# Patient Record
Sex: Female | Born: 1995 | Hispanic: Yes | Marital: Single | Smoking: Never smoker
Health system: Southern US, Community
[De-identification: ages and names within clinical notes are randomized; demographics above are authoritative.]

## PROBLEM LIST (undated history)

## (undated) DIAGNOSIS — Z789 Other specified health status: Secondary | ICD-10-CM

## (undated) HISTORY — DX: Other specified health status: Z78.9

## (undated) HISTORY — PX: NO PAST SURGERIES: SHX2092

---
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
## 2018-07-30 ENCOUNTER — Other Ambulatory Visit: Payer: Self-pay

## 2018-07-30 ENCOUNTER — Emergency Department (HOSPITAL_COMMUNITY)
Admission: EM | Admit: 2018-07-30 | Discharge: 2018-07-30 | Disposition: A | Discharge status: Home / Self Care | Payer: Self-pay | Attending: Emergency Medicine | Admitting: Emergency Medicine

## 2018-07-30 ENCOUNTER — Encounter (HOSPITAL_COMMUNITY): Payer: Self-pay | Admitting: Student

## 2018-07-30 ENCOUNTER — Emergency Department (HOSPITAL_COMMUNITY): Payer: Worker's Compensation

## 2018-07-30 DIAGNOSIS — Y9289 Other specified places as the place of occurrence of the external cause: Secondary | ICD-10-CM | POA: Insufficient documentation

## 2018-07-30 DIAGNOSIS — S60222A Contusion of left hand, initial encounter: Secondary | ICD-10-CM | POA: Insufficient documentation

## 2018-07-30 DIAGNOSIS — Z79899 Other long term (current) drug therapy: Secondary | ICD-10-CM | POA: Insufficient documentation

## 2018-07-30 DIAGNOSIS — W230XXA Caught, crushed, jammed, or pinched between moving objects, initial encounter: Secondary | ICD-10-CM | POA: Insufficient documentation

## 2018-07-30 DIAGNOSIS — S6992XA Unspecified injury of left wrist, hand and finger(s), initial encounter: Secondary | ICD-10-CM

## 2018-07-30 DIAGNOSIS — Y9389 Activity, other specified: Secondary | ICD-10-CM | POA: Insufficient documentation

## 2018-07-30 DIAGNOSIS — Y990 Civilian activity done for income or pay: Secondary | ICD-10-CM | POA: Insufficient documentation

## 2018-07-30 MED ORDER — NAPROXEN 500 MG PO TABS: 500.0000 mg | ORAL_TABLET | Freq: Two times a day (BID) | ORAL | 0 refills | Status: DC

## 2018-07-30 MED ORDER — OXYCODONE-ACETAMINOPHEN 5-325 MG PO TABS: 1.0000 | ORAL_TABLET | Freq: Once | ORAL | Status: DC

## 2018-07-30 MED ORDER — ACETAMINOPHEN 325 MG PO TABS
650.0000 mg | ORAL_TABLET | Freq: Once | ORAL | Status: AC
  Administered 2018-07-30: 650 mg via ORAL
  Filled 2018-07-30: qty 2

## 2018-07-30 NOTE — ED Provider Notes (Signed)
[facility]   CSN: [id number] Arrival date & time: 07/30/18  1058    History   Chief Complaint Chief Complaint  Patient presents with  . Hand Injury    HPI Sierra Shannon is a 23 y.o. female without significant past medical hx who presents to the ED with complaints of L hand pain s/p injury which occurred just PTA. Patient states she was at work when a heavy steel door closed on her L hand/wrist. Onset of pain immediately. Currently 6/10 in severity, worse with movement, no alleviating factors, no intervention PTA. Associated bruising & swelling. R hand dominant. Denies numbness, tingling, or weakness. No other areas of injury. LMP 1 month prior, denies chance of pregnancy.      HPI  No past medical history on file.  Patient Active Problem List   Diagnosis Date Noted  . Foster care (status) 09/29/2015    No past surgical history on file.   OB History   No obstetric history on file.      Home Medications    Prior to Admission medications   Medication Sig Start Date End Date Taking? Authorizing Provider  benzonatate (TESSALON) 100 MG capsule Take 2 capsules (200 mg total) by mouth 2 (two) times daily as needed for cough. 01/31/16   Barrett HenleNadeau, Nicole Elizabeth, PA-C  cephALEXin (KEFLEX) 500 MG capsule Take 1 capsule (500 mg total) by mouth 4 (four) times daily. 03/07/16   Elpidio AnisUpstill, Shari, PA-C  oxymetazoline (AFRIN NASAL SPRAY) 0.05 % nasal spray Place 1 spray into both nostrils 2 (two) times daily. Do not use spray for more than 3 days to prevent rebound rhinorrhea 01/31/16   Barrett HenleNadeau, Nicole Elizabeth, PA-C  phenazopyridine (PYRIDIUM) 200 MG tablet Take 1 tablet (200 mg total) by mouth 3 (three) times daily. 03/07/16   Elpidio AnisUpstill, Shari, PA-C    Family History No family history on file.  Social History Social History   Tobacco Use  . Smoking status: Never Smoker  . Smokeless tobacco: Never Used  Substance Use  Topics  . Alcohol use: No  . Drug use: No     Allergies   Patient has no known allergies.   Review of Systems Review of Systems  Constitutional: Negative for chills and fever.  Musculoskeletal: Positive for arthralgias and joint swelling.  Skin: Positive for color change (bruising).  Neurological: Negative for weakness and numbness.     Physical Exam Updated Vital Signs BP 127/68 (BP Location: Right Arm)   Pulse 76   Temp 99 F (37.2 C) (Oral)   Resp 15   SpO2 100%   Physical Exam Vitals signs and nursing note reviewed.  Constitutional:      General: She is not in acute distress.    Appearance: Normal appearance. She is not ill-appearing or toxic-appearing.  HENT:     Head: Normocephalic and atraumatic.  Neck:     Musculoskeletal: Normal range of motion and neck supple.     Comments: No midline tenderness.  Cardiovascular:     Rate and Rhythm: Normal rate.     Pulses:          Radial pulses are 2+ on the right side and 2+ on the left side.  Pulmonary:     Effort: No respiratory distress.     Breath sounds: Normal breath sounds.  Musculoskeletal:     Comments: Upper extremities: Base of the L 1st digit w/ swelling & ecchymosis w/ mild erythema. Otherwise no appreciable  swelling, edema, erythema, ecchymosis, warmth, or open wounds. Patient has intact AROM throughout the UEs including MCP/IP joints with the exception of mild decrease in flexion/extension of the L 1st IP/MCP joint- able to move through majority of each of these motions.  Tender to palpation the the L 1st phalange, IP joint, & MCP as well as the anatomical snuffbox, and the distal radius. Otherwise nontender.   Skin:    General: Skin is warm and dry.     Capillary Refill: Capillary refill takes less than 2 seconds.  Neurological:     Mental Status: She is alert.     Comments: Alert. Clear speech. Sensation grossly intact to bilateral upper extremities. 5/5 symmetric grip strength. Able to perform OK  sign w/ mild limitation in thumb extension to LUE, OK sign, & cross 2nd/3rd digits.  Ambulatory.   Psychiatric:        Mood and Affect: Mood normal.        Behavior: Behavior normal.    ED Treatments / Results  Labs (all labs ordered are listed, but only abnormal results are displayed) Labs Reviewed - No data to display  EKG None  Radiology Dg Wrist Complete Left  Result Date: 07/30/2018 CLINICAL DATA:  Door closed on left hand and wrist at work. EXAM: LEFT WRIST - COMPLETE 3+ VIEW; LEFT HAND - COMPLETE 3+ VIEW COMPARISON:  None. FINDINGS: There is no evidence of fracture or dislocation. There is no evidence of arthropathy or other focal bone abnormality. Soft tissues are unremarkable. IMPRESSION: Negative. Electronically Signed   By: Obie Dredge M.D.   On: 07/30/2018 12:05   Dg Hand Complete Left  Result Date: 07/30/2018 CLINICAL DATA:  Door closed on left hand and wrist at work. EXAM: LEFT WRIST - COMPLETE 3+ VIEW; LEFT HAND - COMPLETE 3+ VIEW COMPARISON:  None. FINDINGS: There is no evidence of fracture or dislocation. There is no evidence of arthropathy or other focal bone abnormality. Soft tissues are unremarkable. IMPRESSION: Negative. Electronically Signed   By: Obie Dredge M.D.   On: 07/30/2018 12:05    Procedures Procedures (including critical care time)  SPLINT APPLICATION Date/Time: 12:46 PM Authorized by: Harvie Heck Consent: Verbal consent obtained. Risks and benefits: risks, benefits and alternatives were discussed Consent given by: patient Splint applied by: RN Location details: LUE Splint type: thumb spica brace Supplies used: thumb spica brace Post-procedure: The splinted body part was neurovascularly unchanged following the procedure. Patient tolerance: Patient tolerated the procedure well with no immediate complications.   Medications Ordered in ED Medications  acetaminophen (TYLENOL) tablet 650 mg (650 mg Oral Given 07/30/18 1121)      Initial Impression / Assessment and Plan / ED Course  I have reviewed the triage vital signs and the nursing notes.  Pertinent labs & imaging results that were available during my care of the patient were reviewed by me and considered in my medical decision making (see chart for details).    Patient presents to the ED with complaints of pain to the  L hand/wrist pain s/p injury just PTA. Exam without some ecchymosis & swelling. ROM fairly intact w/ very mild limitation of 1st IP/MCP. Tender to palpation to 1st phalanges, IP, MCP, snuffbox area and stial L radius. NVI distally. Xray negative for fracture/dislocation. Given she is tender over the anatomical snuffbox will place in her thumb spica brace to be worn at all times w/ hand surgery follow up. PRICE recommended. Prescription for Naproxen. I discussed results, treatment plan, need  for follow-up, and return precautions with the patient. Provided opportunity for questions, patient confirmed understanding and are in agreement with plan.   Final Clinical Impressions(s) / ED Diagnoses   Final diagnoses:  Injury of left hand, initial encounter    ED Discharge Orders         Ordered    naproxen (NAPROSYN) 500 MG tablet  2 times daily     07/30/18 [address], [place], PA-C 07/30/18 1246    Benjiman Core, MD 07/30/18 1506

## 2018-07-30 NOTE — ED Triage Notes (Signed)
Pt at work was holding a door and let it go abruptly and it slammed into her L hand.  No gross deformity noted. Pain and redness extends from L thumb to wrist.  No abrasions, CMS wnl

## 2018-07-30 NOTE — Discharge Instructions (Addendum)
Please read and follow all provided instructions.  You have been seen today for an injury to your left hand, the x-rays were negative, but we have placed you in a brace as there is a bone in the hand that sometimes does not show fractures on the initial imaging- for this reason we would like you to remain in the brace until you have followed up with orthopedics or primary care.   Tests performed today include: An x-ray of the affected area - does NOT show any broken bones or dislocations.  Vital signs. See below for your results today.   Home care instructions: -- *PRICE in the first 24-48 hours after injury: Protect (with brace, splint, sling), if given by your provider Rest Ice- Do not apply ice pack directly to your skin, place towel or similar between your skin and ice/ice pack. Apply ice for 20 min, then remove for 40 min while awake Compression- Wear brace, elastic bandage, splint as directed by your provider Elevate affected extremity above the level of your heart when not walking around for the first 24-48 hours   Medications:   - Naproxen is a nonsteroidal anti-inflammatory medication that will help with pain and swelling. Be sure to take this medication as prescribed with food, 1 pill every 12 hours,  It should be taken with food, as it can cause stomach upset, and more seriously, stomach bleeding. Do not take other nonsteroidal anti-inflammatory medications with this such as Advil, Motrin, Aleve, Mobic, Goodie Powder, or Motrin.    You make take Tylenol per over the counter dosing with these medications.   We have prescribed you new medication(s) today. Discuss the medications prescribed today with your pharmacist as they can have adverse effects and interactions with your other medicines including over the counter and prescribed medications. Seek medical evaluation if you start to experience new or abnormal symptoms after taking one of these medicines, seek care immediately if you  start to experience difficulty breathing, feeling of your throat closing, facial swelling, or rash as these could be indications of a more serious allergic reaction   Follow-up instructions: Please follow-up with your primary care provider or the provided orthopedic physician (bone specialist) if you continue to have significant pain in 1 week. In this case you may have a more severe injury that requires further care.   Return instructions:  Please return if your digits or extremity are numb or tingling, appear gray or blue, or you have severe pain (also elevate the extremity and loosen splint or wrap if you were given one) Please return if you have redness or fevers.  Please return to the Emergency Department if you experience worsening symptoms.  Please return if you have any other emergent concerns. Additional Information:  Your vital signs today were: BP 127/68 (BP Location: Right Arm)    Pulse 76    Temp 99 F (37.2 C) (Oral)    Resp 15    Ht 5\' 2"  (1.575 m)    Wt 56.7 kg    SpO2 100%    BMI 22.86 kg/m  If your blood pressure (BP) was elevated above 135/85 this visit, please have this repeated by your doctor within one month. ---------------

## 2018-09-30 ENCOUNTER — Inpatient Hospital Stay (HOSPITAL_COMMUNITY)
Admission: AD | Admit: 2018-09-30 | Discharge: 2018-10-01 | Disposition: A | Discharge status: Home / Self Care | Payer: Self-pay | Attending: Obstetrics & Gynecology | Admitting: Obstetrics & Gynecology

## 2018-09-30 ENCOUNTER — Inpatient Hospital Stay (HOSPITAL_COMMUNITY): Payer: Self-pay

## 2018-09-30 ENCOUNTER — Other Ambulatory Visit: Payer: Self-pay

## 2018-09-30 ENCOUNTER — Encounter (HOSPITAL_COMMUNITY): Payer: Self-pay | Admitting: Student

## 2018-09-30 DIAGNOSIS — Z3A01 Less than 8 weeks gestation of pregnancy: Secondary | ICD-10-CM | POA: Insufficient documentation

## 2018-09-30 DIAGNOSIS — O209 Hemorrhage in early pregnancy, unspecified: Secondary | ICD-10-CM | POA: Insufficient documentation

## 2018-09-30 DIAGNOSIS — O3680X0 Pregnancy with inconclusive fetal viability, not applicable or unspecified: Secondary | ICD-10-CM

## 2018-09-30 DIAGNOSIS — O26891 Other specified pregnancy related conditions, first trimester: Secondary | ICD-10-CM

## 2018-09-30 DIAGNOSIS — O26899 Other specified pregnancy related conditions, unspecified trimester: Secondary | ICD-10-CM

## 2018-09-30 DIAGNOSIS — Z6791 Unspecified blood type, Rh negative: Secondary | ICD-10-CM

## 2018-09-30 LAB — URINALYSIS, ROUTINE W REFLEX MICROSCOPIC
Bilirubin Urine: NEGATIVE
Glucose, UA: NEGATIVE mg/dL
Ketones, ur: 20 mg/dL — AB
Leukocytes,Ua: NEGATIVE
Nitrite: NEGATIVE
Protein, ur: NEGATIVE mg/dL
Specific Gravity, Urine: 1.026 (ref 1.005–1.030)
pH: 5 (ref 5.0–8.0)

## 2018-09-30 LAB — WET PREP, GENITAL
Clue Cells Wet Prep HPF POC: NONE SEEN
Sperm: NONE SEEN
Trich, Wet Prep: NONE SEEN
Yeast Wet Prep HPF POC: NONE SEEN

## 2018-09-30 LAB — POCT PREGNANCY, URINE: Preg Test, Ur: NEGATIVE

## 2018-09-30 LAB — HCG, QUANTITATIVE, PREGNANCY: hCG, Beta Chain, Quant, S: 26 m[IU]/mL — ABNORMAL HIGH (ref <5)

## 2018-09-30 LAB — ABO/RH: ABO/RH(D): O NEG

## 2018-09-30 MED ORDER — RHO D IMMUNE GLOBULIN 1500 UNIT/2ML IJ SOSY
300.0000 ug | PREFILLED_SYRINGE | Freq: Once | INTRAMUSCULAR | Status: AC
  Administered 2018-09-30: 300 ug via INTRAMUSCULAR
  Filled 2018-09-30: qty 2

## 2018-09-30 NOTE — MAU Note (Signed)
Pt reports to MAU stating last month she missed her period completely and then took two test they were both negative. Pt states that about 1 week ago she started bleeding and it is consistent with her period but she took another test yesterday and it came back faint positive after 3 min. Pt reports a hx of miscarriage. Pt is unsure if she passed a clot or the pregnancy earlier today. Pt used 2 pads today.

## 2018-09-30 NOTE — MAU Provider Note (Addendum)
Chief Complaint: Abdominal Pain and Vaginal Bleeding   First Provider Initiated Contact with Patient 09/30/18 2012     SUBJECTIVE HPI: Sierra Shannon is a 23 y.o. G2P2210 at 1852w6d by LMP who presents [facility] reporting abdominal pain & vaginal bleeding. Symptoms started 1 week ago. Reports suprapubic cramping & light vaginal bleeding. Is not saturating pads or passing clots. States the bleeding is like a "really light period".   Location: abdomen Quality: cramping Severity: currently denies/10 on pain scale Duration: 1 week Timing: intermittent Modifying factors: none Associated signs and symptoms: vaginal bleeding  No past medical history on file. OB History  No obstetric history on file.   No past surgical history on file. Social History   Socioeconomic History  . Marital status: Single    Spouse name: Not on file  . Number of children: Not on file  . Years of education: Not on file  . Highest education level: Not on file  Occupational History  . Not on file  Social Needs  . Financial resource strain: Not on file  . Food insecurity    Worry: Not on file    Inability: Not on file  . Transportation needs    Medical: Not on file    Non-medical: Not on file  Tobacco Use  . Smoking status: Never Smoker  . Smokeless tobacco: Never Used  Substance and Sexual Activity  . Alcohol use: No  . Drug use: No  . Sexual activity: Not on file  Lifestyle  . Physical activity    Days per week: Not on file    Minutes per session: Not on file  . Stress: Not on file  Relationships  . Social Musicianconnections    Talks on phone: Not on file    Gets together: Not on file    Attends religious service: Not on file    Active member of club or organization: Not on file    Attends meetings of clubs or organizations: Not on file    Relationship status: Not on file  . Intimate partner violence    Fear of current or ex partner: Not on file    Emotionally abused: Not on file     Physically abused: Not on file    Forced sexual activity: Not on file  Other Topics Concern  . Not on file  Social History Narrative  . Not on file   No family history on file. No current facility-administered medications on file prior to encounter.    Current Outpatient Medications on File Prior to Encounter  Medication Sig Dispense Refill  . benzonatate (TESSALON) 100 MG capsule Take 2 capsules (200 mg total) by mouth 2 (two) times daily as needed for cough. 20 capsule 0  . cephALEXin (KEFLEX) 500 MG capsule Take 1 capsule (500 mg total) by mouth 4 (four) times daily. 20 capsule 0  . naproxen (NAPROSYN) 500 MG tablet Take 1 tablet (500 mg total) by mouth 2 (two) times daily. 10 tablet 0  . oxymetazoline (AFRIN NASAL SPRAY) 0.05 % nasal spray Place 1 spray into both nostrils 2 (two) times daily. Do not use spray for more than 3 days to prevent rebound rhinorrhea 30 mL 0  . phenazopyridine (PYRIDIUM) 200 MG tablet Take 1 tablet (200 mg total) by mouth 3 (three) times daily. 6 tablet 0   No Known Allergies  I have reviewed patient's Past Medical Hx, Surgical Hx, Family Hx, Social Hx, medications and allergies.   Review of Systems  Constitutional: Negative.   Gastrointestinal: Positive for abdominal pain (none currently).  Genitourinary: Positive for vaginal bleeding.    OBJECTIVE Patient Vitals for the past 24 hrs:  BP Temp Temp src Pulse Resp SpO2  09/30/18 1938 134/64 98.1 F (36.7 C) Oral 60 17 -  09/30/18 1700 111/79 98.5 F (36.9 C) Oral 72 14 100 %   Constitutional: Well-developed, well-nourished female in no acute distress.  Respiratory: normal rate and effort.  MS: Extremities nontender, no edema, normal ROM Neurologic: Alert and oriented x 4.    LAB RESULTS Results for orders placed or performed during the hospital encounter of 09/30/18 (from the past 24 hour(s))  Pregnancy, urine POC     Status: None   Collection Time: 09/30/18  7:49 PM  Result Value Ref  Range   Preg Test, Ur NEGATIVE NEGATIVE  Urinalysis, Routine w reflex microscopic     Status: Abnormal   Collection Time: 09/30/18  8:00 PM  Result Value Ref Range   Color, Urine YELLOW YELLOW   APPearance CLEAR CLEAR   Specific Gravity, Urine 1.026 1.005 - 1.030   pH 5.0 5.0 - 8.0   Glucose, UA NEGATIVE NEGATIVE mg/dL   Hgb urine dipstick LARGE (A) NEGATIVE   Bilirubin Urine NEGATIVE NEGATIVE   Ketones, ur 20 (A) NEGATIVE mg/dL   Protein, ur NEGATIVE NEGATIVE mg/dL   Nitrite NEGATIVE NEGATIVE   Leukocytes,Ua NEGATIVE NEGATIVE   RBC / HPF 11-20 0 - 5 RBC/hpf   WBC, UA 0-5 0 - 5 WBC/hpf   Bacteria, UA RARE (A) NONE SEEN   Squamous Epithelial / LPF 0-5 0 - 5   Mucus PRESENT   ABO/Rh     Status: None   Collection Time: 09/30/18  8:30 PM  Result Value Ref Range   ABO/RH(D)      O NEG Performed at [facility], [address] [place], [place] [address]   hCG, quantitative, pregnancy     Status: Abnormal   Collection Time: 09/30/18  8:30 PM  Result Value Ref Range   hCG, Beta Chain, Quant, S 26 (H) <5 mIU/mL  Rh IG workup (includes ABO/Rh)     Status: None (Preliminary result)   Collection Time: 09/30/18  8:30 PM  Result Value Ref Range   Gestational Age(Wks) 4    ABO/RH(D) O NEG    Antibody Screen NEG    Unit Number [id number]    Blood Component Type RHIG    Unit division 00    Status of Unit ALLOCATED    Transfusion Status OK TO TRANSFUSE     IMAGING No results found.  MAU COURSE Orders Placed This Encounter  Procedures  . Wet prep, genital  . US OB LESS THAN 14 WEEKS WITH OB TRANSVAGINAL  . Urinalysis, Routine w reflex microscopic  . hCG, quantitative, pregnancy  . Pregnancy, urine POC  . ABO/Rh  . Rh IG workup (includes ABO/Rh)   Meds ordered this encounter  Medications  . rho (d) immune globulin (RHIG/RHOPHYLAC) injection 300 mcg    MDM UPT negative. Patient reports positive at home yesterday so will proceed with HCG HCG 26 O  negative  Rhogam ordered. Patient will be brought to room for further evaluation since her hcg is positive.   Care turned over to Wynelle BourgeoisMarie Williams CNM Judeth HornLawrence, Erin, NP 09/30/2018  10:03 PM  [place] Ob Less Than 14 Weeks With Ob Transvaginal  Result Date: 09/30/2018 CLINICAL DATA:  23 year old female with vaginal bleeding in the 1st trimester  of pregnancy. Quantitative beta hCG 26. Estimated gestational age by LMP 7 weeks 6 days. EXAM: OBSTETRIC <14 WK [place] AND TRANSVAGINAL OB US TECHNIQUE: Both transabdominal and transvaginal ultrasound examinations were performed for complete evaluation of the gestation as well as the maternal uterus, adnexal regions, and pelvic cul-de-sac. Transvaginal technique was performed to assess early pregnancy. COMPARISON:  None. FINDINGS: Intrauterine gestational sac: None Maternal uterus/adnexae: Bland appearing endometrium, only 5-6 millimeters in thickness. Small volume of simple appearing pelvic free fluid. The left ovary appears normal measuring 2.6 x 1.5 x 1.8 centimeters. The right ovary also appears normal measuring 3.0 x 1.8 x 2.0 centimeters. IMPRESSION: 1. No IUP identified. Both ovaries appear within normal limits. Small volume of simple appearing pelvic free fluid. 2. Differential considerations include early IUP, failed IUP, and occult ectopic pregnancy. 3. Recommend serial quantitative beta HCG and repeat ultrasound as necessary. Electronically Signed   By: Genevie Ann M.D.   On: 09/30/2018 22:48   Discussed findings with patient We still cannot rule out ectopic pregnancy just yet Will need to repeat HCG in 48 hours Ectopic precautions  Encouraged to return here or to other Urgent Care/ED if she develops worsening of symptoms, increase in pain, fever, or other concerning symptoms.    Seabron Spates, CNM

## 2018-09-30 NOTE — MAU Note (Signed)
Pt arrived from [facility] with no notes. Appeared on MAU board at 1 hour 59 min.

## 2018-10-01 DIAGNOSIS — Z3A01 Less than 8 weeks gestation of pregnancy: Secondary | ICD-10-CM

## 2018-10-01 DIAGNOSIS — O360910 Maternal care for other rhesus isoimmunization, first trimester, not applicable or unspecified: Secondary | ICD-10-CM

## 2018-10-01 DIAGNOSIS — R109 Unspecified abdominal pain: Secondary | ICD-10-CM

## 2018-10-01 DIAGNOSIS — O3680X0 Pregnancy with inconclusive fetal viability, not applicable or unspecified: Secondary | ICD-10-CM

## 2018-10-01 DIAGNOSIS — O26891 Other specified pregnancy related conditions, first trimester: Secondary | ICD-10-CM

## 2018-10-01 LAB — RH IG WORKUP (INCLUDES ABO/RH)
ABO/RH(D): O NEG
Antibody Screen: NEGATIVE
Gestational Age(Wks): 4
Unit division: 0

## 2018-10-01 LAB — GC/CHLAMYDIA PROBE AMP (~~LOC~~) NOT AT ARMC
Chlamydia: NEGATIVE
Neisseria Gonorrhea: NEGATIVE

## 2018-10-01 NOTE — Discharge Instructions (Signed)
Vaginal Bleeding During Pregnancy, First Trimester ° °A small amount of bleeding (spotting) from the vagina is common during early pregnancy. Sometimes the bleeding is normal and does not cause problems. At other times, though, bleeding may be a sign of something serious. Tell your doctor about any bleeding from your vagina right away. °Follow these instructions at home: °Activity °· Follow your doctor's instructions about how active you can be. °· If needed, make plans for someone to help with your normal activities. °· Do not have sex or orgasms until your doctor says that this is safe. °General instructions °· Take over-the-counter and prescription medicines only as told by your doctor. °· Watch your condition for any changes. °· Write down: °? The number of pads you use each day. °? How often you change pads. °? How soaked (saturated) your pads are. °· Do not use tampons. °· Do not douche. °· If you pass any tissue from your vagina, save it to show to your doctor. °· Keep all follow-up visits as told by your doctor. This is important. °Contact a doctor if: °· You have vaginal bleeding at any time while you are pregnant. °· You have cramps. °· You have a fever. °Get help right away if: °· You have very bad cramps in your back or belly (abdomen). °· You pass large clots or a lot of tissue from your vagina. °· Your bleeding gets worse. °· You feel light-headed. °· You feel weak. °· You pass out (faint). °· You have chills. °· You are leaking fluid from your vagina. °· You have a gush of fluid from your vagina. °Summary °· Sometimes vaginal bleeding during pregnancy is normal and does not cause problems. At other times, bleeding may be a sign of something serious. °· Tell your doctor about any bleeding from your vagina right away. °· Follow your doctor's instructions about how active you can be. You may need someone to help you with your normal activities. °This information is not intended to replace advice given to  you by your health care provider. Make sure you discuss any questions you have with your health care provider. °Document Released: 07/20/2013 Document Revised: 06/24/2018 Document Reviewed: 06/06/2016 °Elsevier Patient Education © 2020 Elsevier Inc. ° °

## 2018-10-03 ENCOUNTER — Inpatient Hospital Stay (HOSPITAL_COMMUNITY)
Admission: AD | Admit: 2018-10-03 | Discharge: 2018-10-03 | Disposition: A | Discharge status: Home / Self Care | Payer: Self-pay | Attending: Obstetrics and Gynecology | Admitting: Obstetrics and Gynecology

## 2018-10-03 ENCOUNTER — Other Ambulatory Visit: Payer: Self-pay

## 2018-10-03 DIAGNOSIS — O039 Complete or unspecified spontaneous abortion without complication: Secondary | ICD-10-CM | POA: Insufficient documentation

## 2018-10-03 LAB — HCG, QUANTITATIVE, PREGNANCY: hCG, Beta Chain, Quant, S: 8 m[IU]/mL — ABNORMAL HIGH (ref <5)

## 2018-10-03 NOTE — MAU Note (Signed)
.   Sierra Shannon is a 23 y.o. at [redacted]w[redacted]d here in MAU for follow up quant. Pt denies any vaginal bleeding or pain LMP: 08/06/18 Pain score: 0 Vitals:   10/03/18 1820  BP: (!) 114/49  Pulse: 69  Resp: 16  Temp: 98.9 F (37.2 C)  SpO2: 100%     Lab orders placed from triage: QUANT

## 2018-10-03 NOTE — Discharge Instructions (Signed)
Miscarriage °A miscarriage is the loss of an unborn baby (fetus) before the 20th week of pregnancy. Most miscarriages happen during the first 3 months of pregnancy. Sometimes, a miscarriage can happen before a woman knows that she is pregnant. °Having a miscarriage can be an emotional experience. If you have had a miscarriage, talk with your health care provider about any questions you may have about miscarrying, the grieving process, and your plans for future pregnancy. °What are the causes? °A miscarriage may be caused by: °· Problems with the genes or chromosomes of the fetus. These problems make it impossible for the baby to develop normally. They are often the result of random errors that occur early in the development of the baby, and are not passed from parent to child (not inherited). °· Infection of the cervix or uterus. °· Conditions that affect hormone balance in the body. °· Problems with the cervix, such as the cervix opening and thinning before pregnancy is at term (cervical insufficiency). °· Problems with the uterus. These may include: °? A uterus with an abnormal shape. °? Fibroids in the uterus. °? Congenital abnormalities. These are problems that were present at birth. °· Certain medical conditions. °· Smoking, drinking alcohol, or using drugs. °· Injury (trauma). °In many cases, the cause of a miscarriage is not known. °What are the signs or symptoms? °Symptoms of this condition include: °· Vaginal bleeding or spotting, with or without cramps or pain. °· Pain or cramping in the abdomen or lower back. °· Passing fluid, tissue, or blood clots from the vagina. °How is this diagnosed? °This condition may be diagnosed based on: °· A physical exam. °· Ultrasound. °· Blood tests. °· Urine tests. °How is this treated? °Treatment for a miscarriage is sometimes not necessary if you naturally pass all the tissue that was in your uterus. If necessary, this condition may be treated with: °· Dilation and  curettage (D&C). This is a procedure in which the cervix is stretched open and the lining of the uterus (endometrium) is scraped. This is done only if tissue from the fetus or placenta remains in the body (incomplete miscarriage). °· Medicines, such as: °? Antibiotic medicine, to treat infection. °? Medicine to help the body pass any remaining tissue. °? Medicine to reduce (contract) the size of the uterus. These medicines may be given if you have a lot of bleeding. °If you have Rh negative blood and your baby was Rh positive, you will need a shot of a medicine called Rh immunoglobulinto protect your future babies from Rh blood problems. "Rh-negative" and "Rh-positive" refer to whether or not the blood has a specific protein found on the surface of red blood cells (Rh factor). °Follow these instructions at home: °Medicines ° °· Take over-the-counter and prescription medicines only as told by your health care provider. °· If you were prescribed antibiotic medicine, take it as told by your health care provider. Do not stop taking the antibiotic even if you start to feel better. °· Do not take NSAIDs, such as aspirin and ibuprofen, unless they are approved by your health care provider. These medicines can cause bleeding. °Activity °· Rest as directed. Ask your health care provider what activities are safe for you. °· Have someone help with home and family responsibilities during this time. °General instructions °· Keep track of the number of sanitary pads you use each day and how soaked (saturated) they are. Write down this information. °· Monitor the amount of tissue or blood clots that   you pass from your vagina. Save any large amounts of tissue for your health care provider to examine. °· Do not use tampons, douche, or have sex until your health care provider approves. °· To help you and your partner with the process of grieving, talk with your health care provider or seek counseling. °· When you are ready, meet with  your health care provider to discuss any important steps you should take for your health. Also, discuss steps you should take to have a healthy pregnancy in the future. °· Keep all follow-up visits as told by your health care provider. This is important. °Where to find more information °· The American Congress of Obstetricians and Gynecologists: www.acog.org °· U.S. Department of Health and Human Services Office of Women’s Health: www.womenshealth.gov °Contact a health care provider if: °· You have a fever or chills. °· You have a foul smelling vaginal discharge. °· You have more bleeding instead of less. °Get help right away if: °· You have severe cramps or pain in your back or abdomen. °· You pass blood clots or tissue from your vagina that is walnut-sized or larger. °· You soak more than 1 regular sanitary pad in an hour. °· You become light-headed or weak. °· You pass out. °· You have feelings of sadness that take over your thoughts, or you have thoughts of hurting yourself. °Summary °· Most miscarriages happen in the first 3 months of pregnancy. Sometimes miscarriage happens before a woman even knows that she is pregnant. °· Follow your health care provider's instruction for home care. Keep all follow-up appointments. °· To help you and your partner with the process of grieving, talk with your health care provider or seek counseling. °This information is not intended to replace advice given to you by your health care provider. Make sure you discuss any questions you have with your health care provider. °Document Released: 08/29/2000 Document Revised: 06/27/2018 Document Reviewed: 04/10/2016 °Elsevier Patient Education © 2020 Elsevier Inc. ° °

## 2018-10-03 NOTE — MAU Provider Note (Signed)
Ms. Sierra Shannon  is a 23 y.o. G1P0  at [redacted]w[redacted]d who presents to MAU today for follow-up quant hCG. The patient denies abdominal pain, vaginal bleeding, N/V or fever.   BP (!) 114/49   Pulse 69   Temp 98.9 F (37.2 C)   Resp 16   LMP 08/06/2018 (Approximate)   SpO2 100%   GENERAL: Well-developed, well-nourished female in no acute distress.  HEENT: Normocephalic, atraumatic.   LUNGS: Effort normal HEART: Regular rate  SKIN: Warm, dry and without erythema PSYCH: Normal mood and affect   A: 1. Miscarriage   -rh negative. Given rhogam 7/14 Component     Latest Ref Rng & Units 09/30/2018 10/03/2018  HCG, Beta Chain, Quant, S     <5 mIU/mL 26 (H) 8 (H)     P: Discharge home Msg to [facility] for [facility] f/u appt Discussed reasons to return to [place], [place], NP  10/03/2018 7:20 PM

## 2018-10-27 ENCOUNTER — Telehealth: Payer: Self-pay | Admitting: Advanced Practice Midwife

## 2018-10-27 NOTE — Telephone Encounter (Signed)
Called the patient to inform of the upcoming visit. The line picked up, however there was no answer. After saying hello a few times the line dropped. Mailing the patient an appointment reminder letter.

## 2018-11-04 ENCOUNTER — Encounter: Payer: Self-pay | Admitting: Family Medicine

## 2018-11-04 ENCOUNTER — Telehealth: Payer: Self-pay

## 2018-11-04 ENCOUNTER — Other Ambulatory Visit: Payer: Self-pay

## 2018-11-04 DIAGNOSIS — Z91199 Patient's noncompliance with other medical treatment and regimen due to unspecified reason: Secondary | ICD-10-CM

## 2018-11-04 DIAGNOSIS — Z5329 Procedure and treatment not carried out because of patient's decision for other reasons: Secondary | ICD-10-CM

## 2018-11-04 NOTE — Progress Notes (Signed)
@  1017am message stating that the number you've dialed is not in service to check the number and dial again. Called the work number and the patient is not at work today.

## 2018-11-04 NOTE — Progress Notes (Signed)
Unable to reach patient for virtual appointment  Wende Mott, CNM 11/04/18 10:57 AM

## 2019-03-20 NOTE — L&D Delivery Note (Signed)
Sierra Shannon is a 24 y.o. G3P0020 s/p SVD at [redacted]w[redacted]d. She was admitted for IOL for GHTN.   ROM: 28h 53m with light mec fluid GBS Status: positive Maximum Maternal Temperature: 99.9  Labor Progress and Delivery: . She progressed with augmentation to complete and pushed 1hr 30 minutes to deliver. Called to room and patient was complete and pushing. Head delivered LOA. No nuchal cord present. Shoulder and body delivered in usual fashion. Infant without spontaneous cry and floppy upon delivery, placed on mother's abdomen, dried and stimulated without response. Cord clamped by CNM and cut by FOB at 30 seconds. Code APGAR called. Cord blood drawn. Cord gas drawn. Placenta delivered spontaneously with gentle cord traction. Fundus firm with massage and Pitocin. Labia, perineum, vagina, and cervix inspected inspected with no lacerations.  Mom and baby stable prior to transfer to postpartum. She plans on breastfeeding. She is unsure of method for birth control. Please see NICU note for additional documentation.   Delivery Note At 10:01 PM a viable and healthy female was delivered via Vaginal, Spontaneous (Presentation:   Occiput Anterior).  APGAR: 4, ; weight pending .   Placenta status: Spontaneous, Intact.  Cord: 3 vessels with the following complications: None.  Cord pH: 7.19  Anesthesia: Epidural Episiotomy: None Lacerations: None Suture Repair: none Est. Blood Loss (mL): 100  Mom to postpartum.  Baby to Couplet care / Skin to Skin.  Sharyon Cable CNM 01/24/2020, 10:27 PM

## 2019-05-19 ENCOUNTER — Other Ambulatory Visit: Payer: Self-pay

## 2019-05-19 ENCOUNTER — Encounter (HOSPITAL_COMMUNITY): Payer: Self-pay | Admitting: *Deleted

## 2019-05-19 ENCOUNTER — Inpatient Hospital Stay (HOSPITAL_COMMUNITY)
Admission: AD | Admit: 2019-05-19 | Discharge: 2019-05-19 | Disposition: A | Discharge status: Home / Self Care | Payer: Self-pay | Attending: Obstetrics & Gynecology | Admitting: Obstetrics & Gynecology

## 2019-05-19 ENCOUNTER — Inpatient Hospital Stay (HOSPITAL_COMMUNITY): Payer: Self-pay

## 2019-05-19 DIAGNOSIS — O26891 Other specified pregnancy related conditions, first trimester: Secondary | ICD-10-CM | POA: Insufficient documentation

## 2019-05-19 DIAGNOSIS — R109 Unspecified abdominal pain: Secondary | ICD-10-CM | POA: Insufficient documentation

## 2019-05-19 DIAGNOSIS — Z3A00 Weeks of gestation of pregnancy not specified: Secondary | ICD-10-CM | POA: Insufficient documentation

## 2019-05-19 DIAGNOSIS — O3680X0 Pregnancy with inconclusive fetal viability, not applicable or unspecified: Secondary | ICD-10-CM

## 2019-05-19 LAB — WET PREP, GENITAL
Clue Cells Wet Prep HPF POC: NONE SEEN
Sperm: NONE SEEN
Trich, Wet Prep: NONE SEEN
Yeast Wet Prep HPF POC: NONE SEEN

## 2019-05-19 LAB — CBC
HCT: 43.1 % (ref 36.0–46.0)
Hemoglobin: 13.9 g/dL (ref 12.0–15.0)
MCH: 27.8 pg (ref 26.0–34.0)
MCHC: 32.3 g/dL (ref 30.0–36.0)
MCV: 86.2 fL (ref 80.0–100.0)
Platelets: 460 10*3/uL — ABNORMAL HIGH (ref 150–400)
RBC: 5 MIL/uL (ref 3.87–5.11)
RDW: 14.8 % (ref 11.5–15.5)
WBC: 12.1 10*3/uL — ABNORMAL HIGH (ref 4.0–10.5)
nRBC: 0 % (ref 0.0–0.2)

## 2019-05-19 LAB — URINALYSIS, ROUTINE W REFLEX MICROSCOPIC
Bilirubin Urine: NEGATIVE
Glucose, UA: NEGATIVE mg/dL
Hgb urine dipstick: NEGATIVE
Ketones, ur: NEGATIVE mg/dL
Leukocytes,Ua: NEGATIVE
Nitrite: NEGATIVE
Protein, ur: NEGATIVE mg/dL
Specific Gravity, Urine: 1.016 (ref 1.005–1.030)
pH: 6 (ref 5.0–8.0)

## 2019-05-19 LAB — HIV ANTIBODY (ROUTINE TESTING W REFLEX): HIV Screen 4th Generation wRfx: NONREACTIVE

## 2019-05-19 LAB — POCT PREGNANCY, URINE: Preg Test, Ur: POSITIVE — AB

## 2019-05-19 LAB — HCG, QUANTITATIVE, PREGNANCY: hCG, Beta Chain, Quant, S: 554 m[IU]/mL — ABNORMAL HIGH (ref <5)

## 2019-05-19 NOTE — Discharge Instructions (Signed)
Return to MAU:  If you have heavier bleeding that soaks through more that 2 pads per hour for an hour or more  If you bleed so much that you feel like you might pass out or you do pass out  If you have significant abdominal pain that is not improved with Tylenol 1000 mg  If you develop a fever > 100.5   

## 2019-05-19 NOTE — MAU Note (Signed)
+  HPT x3, first was 2 days ago.  Is feeling like a little cramping, not much, comes and goes.

## 2019-05-19 NOTE — MAU Provider Note (Signed)
History     CSN: [id number]  Arrival date and time: 05/19/19 1524   None     Chief Complaint  Patient presents with  . Abdominal Pain  . Possible Pregnancy   HPI  Ms.  Sierra Shannon is a 24 y.o. year old [id number] female at [redacted]w[redacted]d weeks gestation who presents to MAU reporting (+) HPT x 3 (first one was taken 3 days ago) and some intermittent cramping "not too much." She denies any VB. She has a h/o SAB x 2 and is concerned this pregnancy will miscarry, too.   History reviewed. No pertinent past medical history.  History reviewed. No pertinent surgical history.  History reviewed. No pertinent family history.  Social History   Tobacco Use  . Smoking status: Never Smoker  . Smokeless tobacco: Never Used  Substance Use Topics  . Alcohol use: No  . Drug use: No    Allergies: No Known Allergies  No medications prior to admission.    Review of Systems  Constitutional: Negative.   Eyes: Negative.   Respiratory: Negative.   Cardiovascular: Negative.   Gastrointestinal: Negative.   Endocrine: Negative.   Genitourinary: Positive for pelvic pain ("cramping off and on; not too much").  Musculoskeletal: Negative.   Skin: Negative.   Allergic/Immunologic: Negative.   Neurological: Negative.   Hematological: Negative.   Psychiatric/Behavioral: Negative.    Physical Exam   Blood pressure 121/79, pulse 81, temperature 98.9 F (37.2 C), temperature source Oral, resp. rate 16, height 5\' 2"  (1.575 m), weight 51.7 kg, last menstrual period 04/02/2019, SpO2 100 %.  Physical Exam  Nursing note and vitals reviewed. Constitutional: She is oriented to person, place, and time. She appears well-developed and well-nourished.  HENT:  Head: Normocephalic and atraumatic.  Eyes: Pupils are equal, round, and reactive to light.  Cardiovascular: Normal rate.  Respiratory: Effort normal.  GI: Soft.  Genitourinary:    Genitourinary Comments: Vaginal swabs collected by self-swab. Pelvic  exam offered - pt declined   Musculoskeletal:        General: Normal range of motion.     Cervical back: Normal range of motion.  Neurological: She is alert and oriented to person, place, and time.  Skin: Skin is warm and dry.  Psychiatric: She has a normal mood and affect. Her behavior is normal. Judgment and thought content normal.  Appears very upset    MAU Course  Procedures  MDM CCUA UPT CBC ABO/Rh HCG Wet Prep GC/CT HIV  OB < 14 wks 04/04/2019 with TV  Results for orders placed or performed during the hospital encounter of 05/19/19 (from the past 72 hour(s))  Pregnancy, urine POC     Status: Abnormal   Collection Time: 05/19/19  3:56 PM  Result Value Ref Range   Preg Test, Ur POSITIVE (A) NEGATIVE    Comment:        THE SENSITIVITY OF THIS METHODOLOGY IS >24 mIU/mL   Urinalysis, Routine w reflex microscopic     Status: Abnormal   Collection Time: 05/19/19  3:58 PM  Result Value Ref Range   Color, Urine YELLOW YELLOW   APPearance HAZY (A) CLEAR   Specific Gravity, Urine 1.016 1.005 - 1.030   pH 6.0 5.0 - 8.0   Glucose, UA NEGATIVE NEGATIVE mg/dL   Hgb urine dipstick NEGATIVE NEGATIVE   Bilirubin Urine NEGATIVE NEGATIVE   Ketones, ur NEGATIVE NEGATIVE mg/dL   Protein, ur NEGATIVE NEGATIVE mg/dL   Nitrite NEGATIVE NEGATIVE   Leukocytes,Ua NEGATIVE NEGATIVE  Comment: Performed at [facility], [address], [place], [place] [address]  GC/Chlamydia probe amp ([place])not at [facility]     Status: None   Collection Time: 05/19/19  4:51 PM  Result Value Ref Range   Neisseria Gonorrhea Negative    Chlamydia Negative    Comment Normal Reference [place] Chlamydia - Negative    Comment      Normal Reference Range Neisseria Gonorrhea - Negative  CBC     Status: Abnormal   Collection Time: 05/19/19  5:00 PM  Result Value Ref Range   WBC 12.1 (H) 4.0 - 10.5 K/uL   RBC 5.00 3.87 - 5.11 MIL/uL   Hemoglobin 13.9 12.0 - 15.0 g/dL   HCT 28.0 03.4 - 91.7 %   MCV 86.2  80.0 - 100.0 fL   MCH 27.8 26.0 - 34.0 pg   MCHC 32.3 30.0 - 36.0 g/dL   RDW 91.5 05.6 - 97.9 %   Platelets 460 (H) 150 - 400 K/uL   nRBC 0.0 0.0 - 0.2 %    Comment: Performed at [facility], [address], [place], [place] [address]  hCG, quantitative, pregnancy     Status: Abnormal   Collection Time: 05/19/19  5:00 PM  Result Value Ref Range   hCG, Beta Chain, Quant, S 554 (H) <5 mIU/mL    Comment:          GEST. AGE      CONC.  (mIU/mL)   <=1 WEEK        5 - 50     2 WEEKS       50 - 500     3 WEEKS       100 - 10,000     4 WEEKS     1,000 - 30,000     5 WEEKS     3,500 - 115,000   6-8 WEEKS     12,000 - 270,000    12 WEEKS     15,000 - 220,000        FEMALE AND NON-PREGNANT FEMALE:     LESS THAN 5 mIU/mL Performed at [facility], [address], [place], [place] [address]   HIV Antibody (routine testing w rflx)     Status: None   Collection Time: 05/19/19  5:00 PM  Result Value Ref Range   HIV Screen 4th Generation wRfx NON REACTIVE NON REACTIVE    Comment: Performed at [facility], [address], [place], [place] [address]  Wet prep, genital     Status: Abnormal   Collection Time: 05/19/19  7:14 PM  Result Value Ref Range   Yeast Wet Prep HPF POC NONE SEEN NONE SEEN   Trich, Wet Prep NONE SEEN NONE SEEN   Clue Cells Wet Prep HPF POC NONE SEEN NONE SEEN   WBC, Wet Prep HPF POC MODERATE (A) NONE SEEN   Sperm NONE SEEN     Comment: Performed at [facility], [address] [place], [place] [address]   US OB LESS THAN 14 WEEKS WITH [place] TRANSVAGINAL  Result Date: 05/19/2019 CLINICAL DATA:  A 24 year old female with last menstrual period April 02, 2019, abdominal pain, and positive urine pregnancy test. EXAM: OBSTETRIC <14 WK [place] AND TRANSVAGINAL OB US TECHNIQUE: Both transabdominal and transvaginal ultrasound examinations were performed for complete evaluation of the gestation as well as the maternal uterus, adnexal regions, and pelvic cul-de-sac.  Transvaginal technique was performed to  assess early pregnancy. COMPARISON:  September 30, 2018. FINDINGS: Intrauterine gestational sac: None. Ectopic gestational sac: None imaged. Maternal uterus/adnexae: A 13 mm endometrial thickness. No free fluid in the pelvis. A 2.7 x 1.6 x 2.0 cm right ovary with normal follicular pattern and blood flow on color Doppler imaging. A 4.0 x 3.0 x 4.0 cm left ovary with normal blood flow on color Doppler imaging and a central 3.2 x 3.1 x 2.5 cm complex cyst without peripheral hypervascularity on Doppler imaging. IMPRESSION: No intrauterine gestational sac or apparent ectopic gestational sac. Differential considerations include an early intrauterine pregnancy or pregnancy of unknown location. Recommend serial quantitative beta HCG levels and repeat ultrasonography as necessary. A 3.2 cm left ovarian complex cyst, possibly an evolving hemorrhagic corpus luteum cyst. Normal right ovary.  No right or left ovarian torsion. Electronically Signed   By: Revonda Humphrey   On: 05/19/2019 19:03     Assessment and Plan  Pregnancy of unknown anatomic location - Plan: Discharge patient - Follow-up HCG level on Thursday 05/21/2019 at [place] provided on ectopic pregnancy & threatened miscarriage   Abdominal pain during pregnancy in first trimester  - Return to MAU:  If you have heavier bleeding that soaks through more that 2 pads per hour for an hour or more  If you bleed so much that you feel like you might pass out or you do pass out  If you have significant abdominal pain that is not improved with Tylenol 1000 mg  If you develop a fever > 100.5   - Discharge patient - Keep scheduled appt at [facility] on Thursday 05/21/2019 @ 0900 - Patient verbalized an understanding of the plan of care and agrees.     Laury Deep, MSN, CNM 05/19/2019, 9:18 PM

## 2019-05-20 LAB — GC/CHLAMYDIA PROBE AMP (~~LOC~~) NOT AT ARMC
Chlamydia: NEGATIVE
Comment: NEGATIVE
Comment: NORMAL
Neisseria Gonorrhea: NEGATIVE

## 2019-05-22 ENCOUNTER — Other Ambulatory Visit: Payer: Self-pay

## 2019-05-22 ENCOUNTER — Ambulatory Visit (INDEPENDENT_AMBULATORY_CARE_PROVIDER_SITE_OTHER): Payer: Self-pay | Admitting: *Deleted

## 2019-05-22 DIAGNOSIS — O3680X0 Pregnancy with inconclusive fetal viability, not applicable or unspecified: Secondary | ICD-10-CM

## 2019-05-22 LAB — BETA HCG QUANT (REF LAB): hCG Quant: 1281 m[IU]/mL

## 2019-05-22 NOTE — Progress Notes (Signed)
Here for stat bhcg. Co once in a while mild cramping less than when in MAU. Denies any bleeding. Explained we will draw stat bhcg and have her leave.then when results come back in about 2 hours,I will review with provider and call her with results. She voices understanding.  Sahory Nordling,RN

## 2019-05-22 NOTE — Progress Notes (Signed)
11:45 received results stat bhcg 1281.Reveiwed results and history with Dr. Alysia Penna. Ordered to notify patient bhcg appropriate rise but we need to do another [place] in about a week to confirm viable pregnancy.  I scheduled [place] for 05/29/19 at 0800.  I called Blasa and notified her of results and plan of care. She voices so much happiness with results. I explained she will have [place] in MFM then come to [place] for results. She voices understanding of plan of care. Clarice Zulauf,RN

## 2019-05-25 NOTE — Progress Notes (Signed)
Agree with A & P. 

## 2019-05-29 ENCOUNTER — Ambulatory Visit (INDEPENDENT_AMBULATORY_CARE_PROVIDER_SITE_OTHER): Payer: Self-pay | Admitting: General Practice

## 2019-05-29 ENCOUNTER — Ambulatory Visit (HOSPITAL_COMMUNITY)
Admission: RE | Admit: 2019-05-29 | Discharge: 2019-05-29 | Disposition: A | Discharge status: Home / Self Care | Payer: Self-pay | Source: Ambulatory Visit | Attending: Obstetrics and Gynecology | Admitting: Obstetrics and Gynecology

## 2019-05-29 ENCOUNTER — Other Ambulatory Visit: Payer: Self-pay

## 2019-05-29 DIAGNOSIS — Z712 Person consulting for explanation of examination or test findings: Secondary | ICD-10-CM

## 2019-05-29 DIAGNOSIS — O3680X0 Pregnancy with inconclusive fetal viability, not applicable or unspecified: Secondary | ICD-10-CM | POA: Insufficient documentation

## 2019-05-29 NOTE — Progress Notes (Signed)
Patient presents to office today for viability ultrasound results. Reviewed results with Dr Vergie Living who finds early gestational sac with yolk sac but no fetal pole visualized yet. Patient should have follow up ultrasound in 11-14 days.   Reviewed results with patient and discussed follow up ultrasound in 2 weeks. Ultrasound will be scheduled upon check out. Patient verbalized understanding.  Chase Caller RN BSN 05/29/19

## 2019-05-31 NOTE — Progress Notes (Signed)
Patient seen and assessed by nursing staff during this encounter. I have reviewed the chart and agree with the documentation and plan.  Matt Delpizzo, MD 05/31/2019 7:29 PM   

## 2019-06-12 ENCOUNTER — Ambulatory Visit (HOSPITAL_COMMUNITY): Admission: RE | Admit: 2019-06-12 | Payer: Self-pay | Source: Ambulatory Visit

## 2019-06-22 ENCOUNTER — Other Ambulatory Visit: Payer: Self-pay

## 2019-06-22 ENCOUNTER — Ambulatory Visit (HOSPITAL_COMMUNITY)
Admission: RE | Admit: 2019-06-22 | Discharge: 2019-06-22 | Disposition: A | Discharge status: Home / Self Care | Payer: Self-pay | Source: Ambulatory Visit | Attending: Obstetrics and Gynecology | Admitting: Obstetrics and Gynecology

## 2019-06-22 DIAGNOSIS — O3680X0 Pregnancy with inconclusive fetal viability, not applicable or unspecified: Secondary | ICD-10-CM | POA: Insufficient documentation

## 2019-06-30 ENCOUNTER — Telehealth: Payer: Self-pay | Admitting: General Practice

## 2019-06-30 ENCOUNTER — Ambulatory Visit (INDEPENDENT_AMBULATORY_CARE_PROVIDER_SITE_OTHER): Payer: Self-pay | Admitting: *Deleted

## 2019-06-30 DIAGNOSIS — Z3480 Encounter for supervision of other normal pregnancy, unspecified trimester: Secondary | ICD-10-CM | POA: Insufficient documentation

## 2019-06-30 NOTE — Telephone Encounter (Signed)
Patient was given number to DSS to apply for Medicaid and number to [employer].

## 2019-06-30 NOTE — Progress Notes (Signed)
  Virtual Visit via Telephone Note  I connected with Sierra Shannon on 06/30/19 at  8:50 AM EDT by telephone and verified that I am speaking with the correct person using two identifiers.  Location: Patient: Sierra Shannon MRN: [id number] Provider: Clovis Pu, RN   I discussed the limitations, risks, security and privacy concerns of performing an evaluation and management service by telephone and the availability of in person appointments. I also discussed with the patient that there may be a patient responsible charge related to this service. The patient expressed understanding and agreed to proceed.   History of Present Illness: PRENATAL INTAKE SUMMARY  Sierra Shannon presents today New OB Nurse Interview.  OB History    Gravida  3   Para      Term      Preterm      AB  2   Living        SAB  2   TAB      Ectopic      Multiple      Live Births             I have reviewed the patient's medical, obstetrical, social, and family histories, medications, and available lab results.  SUBJECTIVE She has no unusual complaints   Observations/Objective: Initial nurse interview for history/labs (New OB)  EDD: 01/23/2020 by early ultrasound GA: [redacted]w[redacted]d [id number] FHT: non face to face interview  GENERAL APPEARANCE: non face to face interview  Assessment and Plan: Normal pregnancy Prenatal care Labs to be complete at next appointment with Donia Ast, NP 07/15/2019 Continue PNV Patient to apply for medicaid or [employer]  Follow Up Instructions:   I discussed the assessment and treatment plan with the patient. The patient was provided an opportunity to ask questions and all were answered. The patient agreed with the plan and demonstrated an understanding of the instructions.   The patient was advised to call back or seek an in-person evaluation if the symptoms worsen or if the condition fails to improve as anticipated.  I provided 15 minutes of  non-face-to-face time during this encounter.   Clovis Pu, RN

## 2019-07-15 ENCOUNTER — Encounter: Payer: Self-pay | Admitting: General Practice

## 2019-07-15 ENCOUNTER — Ambulatory Visit (INDEPENDENT_AMBULATORY_CARE_PROVIDER_SITE_OTHER): Payer: Self-pay | Admitting: Women's Health

## 2019-07-15 ENCOUNTER — Other Ambulatory Visit (HOSPITAL_COMMUNITY)
Admission: RE | Admit: 2019-07-15 | Discharge: 2019-07-15 | Disposition: A | Discharge status: Home / Self Care | Payer: Self-pay | Source: Ambulatory Visit | Attending: Women's Health | Admitting: Women's Health

## 2019-07-15 ENCOUNTER — Encounter: Payer: Self-pay | Admitting: Women's Health

## 2019-07-15 ENCOUNTER — Other Ambulatory Visit: Payer: Self-pay

## 2019-07-15 DIAGNOSIS — Z3480 Encounter for supervision of other normal pregnancy, unspecified trimester: Secondary | ICD-10-CM | POA: Insufficient documentation

## 2019-07-15 DIAGNOSIS — O3680X0 Pregnancy with inconclusive fetal viability, not applicable or unspecified: Secondary | ICD-10-CM

## 2019-07-15 NOTE — Patient Instructions (Addendum)
Maternity Assessment Unit (MAU)  The Maternity Assessment Unit (MAU) is located at the [facility]. The address is: [address], [address], [place], [place] [address]. Please see map below for additional directions.    The Maternity Assessment Unit is designed to help you during your pregnancy, and for up to 6 weeks after delivery, with any pregnancy- or postpartum-related emergencies, if you think you are in labor, or if your water has broken. For example, if you experience nausea and vomiting, vaginal bleeding, severe abdominal or pelvic pain, elevated blood pressure or other problems related to your pregnancy or postpartum time, please come to the Maternity Assessment Unit for assistance.                      Safe Medications in Pregnancy    Acne: Benzoyl Peroxide Salicylic Acid  Backache/Headache: Tylenol: 2 regular strength every 4 hours OR              2 Extra strength every 6 hours  Colds/Coughs/Allergies: Benadryl (alcohol free) 25 mg every 6 hours as needed Breath right strips Claritin Cepacol throat lozenges Chloraseptic throat spray Cold-Eeze- up to three times per day Cough drops, alcohol free Flonase (by prescription only) Guaifenesin Mucinex Robitussin DM (plain only, alcohol free) Saline nasal spray/drops Sudafed (pseudoephedrine) & Actifed ** use only after [redacted] weeks gestation and if you do not have high blood pressure Tylenol Vicks Vaporub Zinc lozenges Zyrtec   Constipation: Colace Ducolax suppositories Fleet enema Glycerin suppositories Metamucil Milk of magnesia Miralax Senokot Smooth move tea  Diarrhea: Kaopectate Imodium A-D  *NO pepto Bismol  Hemorrhoids: Anusol Anusol HC Preparation H Tucks  Indigestion: Tums Maalox Mylanta Zantac  Pepcid  Insomnia: Benadryl (alcohol free) 25mg  every 6 hours as needed Tylenol PM Unisom, no Gelcaps  Leg  Cramps: Tums MagGel  Nausea/Vomiting:  Bonine Dramamine Emetrol Ginger extract Sea bands Meclizine  Nausea medication to take during pregnancy:  Unisom (doxylamine succinate 25 mg tablets) Take one tablet daily at bedtime. If symptoms are not adequately controlled, the dose can be increased to a maximum recommended dose of two tablets daily (1/2 tablet in the morning, 1/2 tablet mid-afternoon and one at bedtime). Vitamin B6 100mg  tablets. Take one tablet twice a day (up to 200 mg per day).  Skin Rashes: Aveeno products Benadryl cream or 25mg  every 6 hours as needed Calamine Lotion 1% cortisone cream  Yeast infection: Gyne-lotrimin 7 Monistat 7   **If taking multiple medications, please check labels to avoid duplicating the same active ingredients **take medication as directed on the label ** Do not exceed 4000 mg of tylenol in 24 hours **Do not take medications that contain aspirin or ibuprofen      AREA PEDIATRIC/FAMILY PRACTICE PHYSICIANS  [facility] [address] [place], [place] [address] Phone - [phone]   Fax - [phone]  [facility]ACK AMOS [address] [place], [place]  [address] Phone - [phone]   Fax - [phone]  [facility] [address] [place], [place]  [address] Phone - [phone]   Fax - [phone]  [facility] [address] [place], [place]  [address] Phone - [phone]   Fax - [phone]  [facility] [address] [place], [place]  [address] Phone - [phone]   Fax - [phone]  [facility] [address], [address] [place], [place]  [address] Phone -  [phone]   Fax - [place] [address], [place], [place]  [address] Phone - [phone]   Fax - [phone]  [place] [address] [place], [place], [place]   [address] Phone - [phone]   Fax - [place] [address] [place], [place]  [address] Phone - [phone]   Fax - [phone] [facility] [place]. [address] [place], [place]  [address] Phone - [phone]   Fax - [phone]  [facility]LE [place] [address] [place], [place]  [address] Phone - [phone]   Fax - [phone]  [facility] [place], [place], [place]  [address] Phone - [phone]   Fax - [place] [address] [place], [place]  [address] Phone - [phone]   Fax - [phone]  [facility] [address] [place], [place]  [address] Phone - [place], [place]  [address] Phone - [phone]   Fax - [place] [address] [place], [place]  [address] Phone - [phone]   Fax - [phone]  [place] [address] [place], [place]  [address] Phone - [phone]   Fax - [place]. [place], [place]  [address] Phone - [phone]   Fax - [place], [place], [place]  [address] Phone - [phone]   Fax - [place] [address], [place], [place]  [address] Phone - [phone]   Fax - [phone]  DAVID RUBIN [address] [place], [place]  [address] Phone - [phone]   Fax - [place] [address] [place], [place]  [address] Phone - [phone]   Fax - [phone]  [place] [address] [place], [place]  [address] Phone - [phone]   Fax - [phone] Arnaldo Natal [address] [place], [place]   [address] Phone - [phone]   Fax - [phone]  [place] [address] [place], [place]  [address] Phone - [phone]   Fax - [place] [address], [place],   [address] Phone - [phone]   Fax - [phone]    Childbirth Education Options: [facility] Department Classes:  Childbirth education classes can help you get ready for a positive parenting experience. You can also meet other expectant parents and get free stuff for your baby. Each class runs for five weeks on the same night and costs $45 for the mother-to-be and her support person. Medicaid covers the cost if you are eligible. Call [phone] to register. [facility] Childbirth Education:  [phone] or [phone] or [email or web address]  Baby & Me Class: Discuss newborn & infant parenting and family adjustment issues with other new mothers in a relaxed environment. Each week brings a new speaker or baby-centered activity. We encourage new mothers to join [place] every Thursday at 11:00am. Babies birth until crawling. No registration or fee. Daddy [employer]: This course offers Dads-to-be the tools and knowledge needed to feel confident on their journey to becoming new fathers. Experienced dads, who have been trained as  coaches, teach dads-to-be how to hold, comfort, diaper, swaddle and play with their infant while being able to support the new mom as well. A class for men taught by men. $25/dad Big Brother/Big Sister: Let your children share in the joy of a new brother or sister in this special class designed just for them. Class includes discussion about how families care for babies: swaddling, holding, diapering, safety as well as how they can be helpful in their new role. This class is designed for children ages 2 to 34, but any age is welcome. Please register each child individually. $5/child  Mom Talk: This mom-led  group offers support and connection to mothers as they journey through the adjustments and struggles of that sometimes overwhelming first year after the birth of a child. Tuesdays at 10:00am and Thursdays at 6:00pm. Babies welcome. No registration or fee. Breastfeeding Support Group: This group is a mother-to-mother support circle where moms have the opportunity to share their breastfeeding experiences. A Lactation Consultant is present for questions and concerns. Meets each Tuesday at 11:00am. No fee or registration. Breastfeeding Your Baby: Learn what to expect in the first days of breastfeeding your newborn.  This class will help you feel more confident with the skills needed to begin your breastfeeding experience. Many new mothers are concerned about breastfeeding after leaving the hospital. This class will also address the most common fears and challenges about breastfeeding during the first few weeks, months and beyond. (call for fee) Comfort Techniques and Tour: This 2 hour interactive class will provide you the opportunity to learn & practice hands-on techniques that can help relieve some of the discomfort of labor and encourage your baby to rotate toward the best position for birth. You and your partner will be able to try a variety of labor positions with birth balls and rebozos as well as practice breathing, relaxation, and visualization techniques. A tour of the [facility] is included with this class. $20 per registrant and support person Childbirth Class- Weekend Option: This class is a Weekend version of our Birth & Baby series. It is designed for parents who have a difficult time fitting several weeks of classes into their schedule. It covers the care of your newborn and the basics of labor and childbirth. It also includes a Maternity Care Center Tour of [facility] and lunch. The class is held two consecutive days: beginning on Friday evening from 6:30 - 8:30 p.m.  and the next day, Saturday from 9 a.m. - 4 p.m. (call for fee) [place] Dolin Class: Interested in a waterbirth?  This informational class will help you discover whether waterbirth is the right fit for you. Education about waterbirth itself, supplies you would need and how to assemble your support team is what you can expect from this class. Some obstetrical practices require this class in order to pursue a waterbirth. (Not all obstetrical practices offer waterbirth-check with your healthcare provider.) Register only the expectant mom, but you are encouraged to bring your partner to class! Required if planning waterbirth, no fee. Infant/Child CPR: Parents, grandparents, babysitters, and friends learn Cardio-Pulmonary Resuscitation skills for infants and children. You will also learn how to treat both conscious and unconscious choking in infants and children. This Family & Friends program does not offer certification. Register each participant individually to ensure that enough mannequins are available. (Call for fee) Grandparent Love: Expecting a grandbaby? This class is for you! Learn about the latest infant care and safety recommendations and ways to  support your own child as he or she transitions into the parenting role. Taught by Registered Nurses who are childbirth instructors, but most importantly...they are grandmothers too! $10/person. Childbirth Class- Natural Childbirth: This series of 5 weekly classes is for expectant parents who want to learn and practice natural methods of coping with the process of labor and childbirth. Relaxation, breathing, massage, visualization, role of the partner, and helpful positioning are highlighted. Participants learn how to be confident in their body's ability to give birth. This class will empower and help parents make informed decisions about their own care. Includes discussion that will help new parents transition into the immediate postpartum period. Maternity Care  Center Tour of [facility] is included. We suggest taking this class between 25-32 weeks, but it's only a recommendation. $75 per registrant and one support person or $30 Medicaid. Childbirth Class- 3 week Series: This option of 3 weekly classes helps you and your labor partner prepare for childbirth. Newborn care, labor & birth, cesarean birth, pain management, and comfort techniques are discussed and a Maternity Care Center Tour of [facility] is included. The class meets at the same time, on the same day of the week for 3 consecutive weeks beginning with the starting date you choose. $60 for registrant and one support person.  Marvelous Multiples: Expecting twins, triplets, or more? This class covers the differences in labor, birth, parenting, and breastfeeding issues that face multiples' parents. NICU tour is included. Led by a Certified Childbirth Educator who is the mother of twins. No fee. Caring for Baby: This class is for expectant and adoptive parents who want to learn and practice the most up-to-date newborn care for their babies. Focus is on birth through the first six weeks of life. Topics include feeding, bathing, diapering, crying, umbilical cord care, circumcision care and safe sleep. Parents learn to recognize symptoms of illness and when to call the pediatrician. Register only the mom-to-be and your partner or support person can plan to come with you! $10 per registrant and support person Childbirth Class- online option: This online class offers you the freedom to complete a Birth and Baby series in the comfort of your own home. The flexibility of this option allows you to review sections at your own pace, at times convenient to you and your support people. It includes additional video information, animations, quizzes, and extended activities. Get organized with helpful eClass tools, checklists, and trackers. Once you register online for the class, you will receive an email within a  few days to accept the invitation and begin the class when the time is right for you. The content will be available to you for 60 days. $60 for 60 days of online access for you and your support people.            First Trimester of Pregnancy The first trimester of pregnancy is from week 1 until the end of week 13 (months 1 through 3). A week after a sperm fertilizes an egg, the egg will implant on the wall of the uterus. This embryo will begin to develop into a baby. Genes from you and your partner will form the baby. The female genes will determine whether the baby will be a boy or a girl. At 6-8 weeks, the eyes and face will be formed, and the heartbeat can be seen on ultrasound. At the end of 12 weeks, all the baby's organs will be formed. Now that you are pregnant, you will want to do everything you  can to have a healthy baby. Two of the most important things are to get good prenatal care and to follow your health care provider's instructions. Prenatal care is all the medical care you receive before the baby's birth. This care will help prevent, find, and treat any problems during the pregnancy and childbirth. Body changes during your first trimester Your body goes through many changes during pregnancy. The changes vary from woman to woman.  You may gain or lose a couple of pounds at first.  You may feel sick to your stomach (nauseous) and you may throw up (vomit). If the vomiting is uncontrollable, call your health care provider.  You may tire easily.  You may develop headaches that can be relieved by medicines. All medicines should be approved by your health care provider.  You may urinate more often. Painful urination may mean you have a bladder infection.  You may develop heartburn as a result of your pregnancy.  You may develop constipation because certain hormones are causing the muscles that push stool through your intestines to slow down.  You may develop hemorrhoids or  swollen veins (varicose veins).  Your breasts may begin to grow larger and become tender. Your nipples may stick out more, and the tissue that surrounds them (areola) may become darker.  Your gums may bleed and may be sensitive to brushing and flossing.  Dark spots or blotches (chloasma, mask of pregnancy) may develop on your face. This will likely fade after the baby is born.  Your menstrual periods will stop.  You may have a loss of appetite.  You may develop cravings for certain kinds of food.  You may have changes in your emotions from day to day, such as being excited to be pregnant or being concerned that something may go wrong with the pregnancy and baby.  You may have more vivid and strange dreams.  You may have changes in your hair. These can include thickening of your hair, rapid growth, and changes in texture. Some women also have hair loss during or after pregnancy, or hair that feels dry or thin. Your hair will most likely return to normal after your baby is born. What to expect at prenatal visits During a routine prenatal visit:  You will be weighed to make sure you and the baby are growing normally.  Your blood pressure will be taken.  Your abdomen will be measured to track your baby's growth.  The fetal heartbeat will be listened to between weeks 10 and 14 of your pregnancy.  Test results from any previous visits will be discussed. Your health care provider may ask you:  How you are feeling.  If you are feeling the baby move.  If you have had any abnormal symptoms, such as leaking fluid, bleeding, severe headaches, or abdominal cramping.  If you are using any tobacco products, including cigarettes, chewing tobacco, and electronic cigarettes.  If you have any questions. Other tests that may be performed during your first trimester include:  Blood tests to find your blood type and to check for the presence of any previous infections. The tests will also be  used to check for low iron levels (anemia) and protein on red blood cells (Rh antibodies). Depending on your risk factors, or if you previously had diabetes during pregnancy, you may have tests to check for high blood sugar that affects pregnant women (gestational diabetes).  Urine tests to check for infections, diabetes, or protein in the urine.  An ultrasound  to confirm the proper growth and development of the baby.  Fetal screens for spinal cord problems (spina bifida) and Down syndrome.  HIV (human immunodeficiency virus) testing. Routine prenatal testing includes screening for HIV, unless you choose not to have this test.  You may need other tests to make sure you and the baby are doing well. Follow these instructions at home: Medicines  Follow your health care provider's instructions regarding medicine use. Specific medicines may be either safe or unsafe to take during pregnancy.  Take a prenatal vitamin that contains at least 600 micrograms (mcg) of folic acid.  If you develop constipation, try taking a stool softener if your health care provider approves. Eating and drinking   Eat a balanced diet that includes fresh fruits and vegetables, whole grains, good sources of protein such as meat, eggs, or tofu, and low-fat dairy. Your health care provider will help you determine the amount of weight gain that is right for you.  Avoid raw meat and uncooked cheese. These carry germs that can cause birth defects in the baby.  Eating four or five small meals rather than three large meals a day may help relieve nausea and vomiting. If you start to feel nauseous, eating a few soda crackers can be helpful. Drinking liquids between meals, instead of during meals, also seems to help ease nausea and vomiting.  Limit foods that are high in fat and processed sugars, such as fried and sweet foods.  To prevent constipation: ? Eat foods that are high in fiber, such as fresh fruits and vegetables,  whole grains, and beans. ? Drink enough fluid to keep your urine clear or pale yellow. Activity  Exercise only as directed by your health care provider. Most women can continue their usual exercise routine during pregnancy. Try to exercise for 30 minutes at least 5 days a week. Exercising will help you: ? Control your weight. ? Stay in shape. ? Be prepared for labor and delivery.  Experiencing pain or cramping in the lower abdomen or lower back is a good sign that you should stop exercising. Check with your health care provider before continuing with normal exercises.  Try to avoid standing for long periods of time. Move your legs often if you must stand in one place for a long time.  Avoid heavy lifting.  Wear low-heeled shoes and practice good posture.  You may continue to have sex unless your health care provider tells you not to. Relieving pain and discomfort  Wear a good support bra to relieve breast tenderness.  Take warm sitz baths to soothe any pain or discomfort caused by hemorrhoids. Use hemorrhoid cream if your health care provider approves.  Rest with your legs elevated if you have leg cramps or low back pain.  If you develop varicose veins in your legs, wear support hose. Elevate your feet for 15 minutes, 3-4 times a day. Limit salt in your diet. Prenatal care  Schedule your prenatal visits by the twelfth week of pregnancy. They are usually scheduled monthly at first, then more often in the last 2 months before delivery.  Write down your questions. Take them to your prenatal visits.  Keep all your prenatal visits as told by your health care provider. This is important. Safety  Wear your seat belt at all times when driving.  Make a list of emergency phone numbers, including numbers for family, friends, the hospital, and police and fire departments. General instructions  Ask your health care provider for a  referral to a local prenatal education class. Begin classes  no later than the beginning of month 6 of your pregnancy.  Ask for help if you have counseling or nutritional needs during pregnancy. Your health care provider can offer advice or refer you to specialists for help with various needs.  Do not use hot tubs, steam rooms, or saunas.  Do not douche or use tampons or scented sanitary pads.  Do not cross your legs for long periods of time.  Avoid cat litter boxes and soil used by cats. These carry germs that can cause birth defects in the baby and possibly loss of the fetus by miscarriage or stillbirth.  Avoid all smoking, herbs, alcohol, and medicines not prescribed by your health care provider. Chemicals in these products affect the formation and growth of the baby.  Do not use any products that contain nicotine or tobacco, such as cigarettes and e-cigarettes. If you need help quitting, ask your health care provider. You may receive counseling support and other resources to help you quit.  Schedule a dentist appointment. At home, brush your teeth with a soft toothbrush and be gentle when you floss. Contact a health care provider if:  You have dizziness.  You have mild pelvic cramps, pelvic pressure, or nagging pain in the abdominal area.  You have persistent nausea, vomiting, or diarrhea.  You have a bad smelling vaginal discharge.  You have pain when you urinate.  You notice increased swelling in your face, hands, legs, or ankles.  You are exposed to fifth disease or chickenpox.  You are exposed to [place] measles (rubella) and have never had it. Get help right away if:  You have a fever.  You are leaking fluid from your vagina.  You have spotting or bleeding from your vagina.  You have severe abdominal cramping or pain.  You have rapid weight gain or loss.  You vomit blood or material that looks like coffee grounds.  You develop a severe headache.  You have shortness of breath.  You have any kind of trauma, such as  from a fall or a car accident. Summary  The first trimester of pregnancy is from week 1 until the end of week 13 (months 1 through 3).  Your body goes through many changes during pregnancy. The changes vary from woman to woman.  You will have routine prenatal visits. During those visits, your health care provider will examine you, discuss any test results you may have, and talk with you about how you are feeling. This information is not intended to replace advice given to you by your health care provider. Make sure you discuss any questions you have with your health care provider. Document Revised: 02/15/2017 Document Reviewed: 02/15/2016 Elsevier Patient Education  2020 [employer].     Deciding about Circumcision in Baby Boys  (The Basics)  What is circumcision?  Circumcision is a surgery that removes the skin that covers the tip of the penis, called the "foreskin" Circumcision is usually done when a boy is between 27 and 69 days old. In the [place], circumcision is common. In some other countries, fewer boys are circumcised. Circumcision is a common tradition in some religions.  Should I have my baby boy circumcised?  There is no easy answer. Circumcision has some benefits. But it also has risks. After talking with your doctor, you will have to decide for yourself what is right for your family.  What are the benefits of circumcision?  Circumcised boys  seem to have slightly lower rates of: ?Urinary tract infections ?Swelling of the opening at the tip of the penis Circumcised men seem to have slightly lower rates of: ?Urinary tract infections ?Swelling of the opening at the tip of the penis ?Penis cancer ?HIV and other infections that you catch during sex ?Cervical cancer in the women they have sex with Even so, in the [place], the risks of these problems are small - even in boys and men who have not been circumcised. Plus, boys and men who are not circumcised  can reduce these extra risks by: ?Cleaning their penis well ?Using condoms during sex  What are the risks of circumcision?  Risks include: ?Bleeding or infection from the surgery ?Damage to or amputation of the penis ?A chance that the doctor will cut off too much or not enough of the foreskin ?A chance that sex won't feel as good later in life Only about 1 out of every 200 circumcisions leads to problems. There is also a chance that your health insurance won't pay for circumcision.  How is circumcision done in baby boys?  First, the baby gets medicine for pain relief. This might be a cream on the skin or a shot into the base of the penis. Next, the doctor cleans the baby's penis well. Then he or she uses special tools to cut off the foreskin. Finally, the doctor wraps a bandage (called gauze) around the baby's penis. If you have your baby circumcised, his doctor or nurse will give you instructions on how to care for him after the surgery. It is important that you follow those instructions carefully.    Places to have your son circumcised (for self-pay patients//Medicaid now covers circumcisions):                                                                      [facility]     [phone]   $480 while you are in hospital         [facility]              [phone]   $269 by 4 wks                      [facility]                     [phone]   $269 by 7 days [facility]                    [phone]   $269 by 4 wks [facility]             [phone]   $225 by 2 wks    These prices sometimes change but are roughly what you can expect to pay. Please call and confirm pricing.   There are many reasons parents decide to have their sons circumsized. During the first year of life circumcised males have a reduced risk of urinary tract infections but after this year the rates between circumcised males and uncircumcised males are the same.  It can reduce rate of HIV and other STI infection later in life.  It is  safe to have your son circumcised outside of the hospital and  the places above perform them regularly.   Deciding about Circumcision in Baby Boys  (Up-to-date The Basics)  What is circumcision?   Circumcision is a surgery that removes the skin that covers the tip of the penis, called the "foreskin" Circumcision is usually done when a boy is between 16 and 46 days old. In the [place], circumcision is common. In some other countries, fewer boys are circumcised. Circumcision is a common tradition in some religions.  Should I have my baby boy circumcised?   There is no easy answer. Circumcision has some benefits. But it also has risks. After talking with your doctor, you will have to decide for yourself what is right for your family.  What are the benefits of circumcision?   Circumcised boys seem to have slightly lower rates of: ?Urinary tract infections ?Swelling of the opening at the tip of the penis Circumcised men seem to have slightly lower rates of: ?Urinary tract infections ?Swelling of the opening at the tip of the penis ?Penis cancer ?HIV and other infections that you catch during sex, or transmit to a future partner(s) ?Cervical cancer in the women they have sex with Even so, in the [place], the risks of these problems are small - even in boys and men who have not been circumcised. Plus, boys and men who are not circumcised can reduce these extra risks by: ?Cleaning their penis well ?Using condoms during sex  What are the risks of circumcision?  Risks include: ?Bleeding or infection from the surgery ?Damage to or amputation of the penis ?A chance that the doctor will cut off too much or not enough of the foreskin ?A chance that sex won't feel as good later in life Only about 1 out of every 200 circumcisions leads to problems. There is also a chance that your health insurance won't pay for circumcision.  How is circumcision done in baby boys?  First, the baby  gets medicine for pain relief. This might be a cream on the skin or a shot into the base of the penis. Next, the doctor cleans the baby's penis well. Then he or she uses special tools to cut off the foreskin. Finally, the doctor wraps a bandage (called gauze) around the baby's penis. If you have your baby circumcised, his doctor or nurse will give you instructions on how to care for him after the surgery. It is important that you follow those instructions carefully.        Contraception Choices - [email or web address] Contraception, also called birth control, refers to methods or devices that prevent pregnancy. Hormonal methods Contraceptive implant  A contraceptive implant is a thin, plastic tube that contains a hormone. It is inserted into the upper part of the arm. It can remain in place for up to 3 years. Progestin-only injections Progestin-only injections are injections of progestin, a synthetic form of the hormone progesterone. They are given every 3 months by a health care provider. Birth control pills  Birth control pills are pills that contain hormones that prevent pregnancy. They must be taken once a day, preferably at the same time each day. Birth control patch  The birth control patch contains hormones that prevent pregnancy. It is placed on the skin and must be changed once a week for three weeks and removed on the fourth week. A prescription is needed to use this method of contraception. Vaginal ring  A vaginal ring contains hormones that prevent pregnancy. It is placed in the vagina for three weeks  and removed on the fourth week. After that, the process is repeated with a new ring. A prescription is needed to use this method of contraception. Emergency contraceptive Emergency contraceptives prevent pregnancy after unprotected sex. They come in pill form and can be taken up to 5 days after sex. They work best the sooner they are taken after having sex. Most emergency  contraceptives are available without a prescription. This method should not be used as your only form of birth control. Barrier methods Female condom  A female condom is a thin sheath that is worn over the penis during sex. Condoms keep sperm from going inside a woman's body. They can be used with a spermicide to increase their effectiveness. They should be disposed after a single use. Female condom  A female condom is a soft, loose-fitting sheath that is put into the vagina before sex. The condom keeps sperm from going inside a woman's body. They should be disposed after a single use. Diaphragm  A diaphragm is a soft, dome-shaped barrier. It is inserted into the vagina before sex, along with a spermicide. The diaphragm blocks sperm from entering the uterus, and the spermicide kills sperm. A diaphragm should be left in the vagina for 6-8 hours after sex and removed within 24 hours. A diaphragm is prescribed and fitted by a health care provider. A diaphragm should be replaced every 1-2 years, after giving birth, after gaining more than 15 lb (6.8 kg), and after pelvic surgery. Cervical cap  A cervical cap is a round, soft latex or plastic cup that fits over the cervix. It is inserted into the vagina before sex, along with spermicide. It blocks sperm from entering the uterus. The cap should be left in place for 6-8 hours after sex and removed within 48 hours. A cervical cap must be prescribed and fitted by a health care provider. It should be replaced every 2 years. Sponge  A sponge is a soft, circular piece of polyurethane foam with spermicide on it. The sponge helps block sperm from entering the uterus, and the spermicide kills sperm. To use it, you make it wet and then insert it into the vagina. It should be inserted before sex, left in for at least 6 hours after sex, and removed and thrown away within 30 hours. Spermicides Spermicides are chemicals that kill or block sperm from entering the cervix  and uterus. They can come as a cream, jelly, suppository, foam, or tablet. A spermicide should be inserted into the vagina with an applicator at least 10-15 minutes before sex to allow time for it to work. The process must be repeated every time you have sex. Spermicides do not require a prescription. Intrauterine contraception Intrauterine device (IUD) An IUD is a T-shaped device that is put in a woman's uterus. There are two types:  Hormone IUD.This type contains progestin, a synthetic form of the hormone progesterone. This type can stay in place for 3-5 years.  Copper IUD.This type is wrapped in copper wire. It can stay in place for 10 years.  Permanent methods of contraception Female tubal ligation In this method, a woman's fallopian tubes are sealed, tied, or blocked during surgery to prevent eggs from traveling to the uterus. Hysteroscopic sterilization In this method, a small, flexible insert is placed into each fallopian tube. The inserts cause scar tissue to form in the fallopian tubes and block them, so sperm cannot reach an egg. The procedure takes about 3 months to be effective. Another form of  birth control must be used during those 3 months. Female sterilization This is a procedure to tie off the tubes that carry sperm (vasectomy). After the procedure, the man can still ejaculate fluid (semen). Natural planning methods Natural family planning In this method, a couple does not have sex on days when the woman could become pregnant. Calendar method This means keeping track of the length of each menstrual cycle, identifying the days when pregnancy can happen, and not having sex on those days. Ovulation method In this method, a couple avoids sex during ovulation. Symptothermal method This method involves not having sex during ovulation. The woman typically checks for ovulation by watching changes in her temperature and in the consistency of cervical mucus. Post-ovulation method In  this method, a couple waits to have sex until after ovulation. Summary  Contraception, also called birth control, means methods or devices that prevent pregnancy.  Hormonal methods of contraception include implants, injections, pills, patches, vaginal rings, and emergency contraceptives.  Barrier methods of contraception can include female condoms, female condoms, diaphragms, cervical caps, sponges, and spermicides.  There are two types of IUDs (intrauterine devices). An IUD can be put in a woman's uterus to prevent pregnancy for 3-5 years.  Permanent sterilization can be done through a procedure for males, females, or both.  Natural family planning methods involve not having sex on days when the woman could become pregnant. This information is not intended to replace advice given to you by your health care provider. Make sure you discuss any questions you have with your health care provider. Document Revised: 03/07/2017 Document Reviewed: 04/07/2016 Elsevier Patient Education  2020 [employer].

## 2019-07-15 NOTE — Progress Notes (Signed)
History:   Sierra Shannon is a 24 y.o. G3P0020 at [redacted]w[redacted]d by early ultrasound being seen today for her first obstetrical visit.  Her obstetrical history is significant for RH negative. Patient does intend to breast feed. Pregnancy history fully reviewed.  Pt reports this is a desired and planned pregnancy. Allergies: NKDA Current Medications: PNVs PMH: No HTN, DM, asthma. PSH: none OB Hx: 2019 - SAB, 2020 - SAB Social Hx: pt does not smoke, drink, or use drugs. Family Hx: none  Patient reports no complaints.      HISTORY: OB History  Gravida Para Term Preterm AB Living  3 0 0 0 2 0  SAB TAB Ectopic Multiple Live Births  2 0 0 0 0    # Outcome Date GA Lbr Len/2nd Weight Sex Delivery Anes PTL Lv  3 Current           2 SAB 2020          1 SAB 2019            No history of Pap, pt age 12, will perform today.  Past Medical History:  Diagnosis Date  . Medical history non-contributory    Past Surgical History:  Procedure Laterality Date  . NO PAST SURGERIES     History reviewed. No pertinent family history. Social History   Tobacco Use  . Smoking status: Never Smoker  . Smokeless tobacco: Never Used  Substance Use Topics  . Alcohol use: No  . Drug use: Never   No Known Allergies Current Outpatient Medications on File Prior to Visit  Medication Sig Dispense Refill  . Prenatal Vit-Fe Fumarate-FA (PRENATAL VITAMINS PO) Take 1 tablet by mouth.     No current facility-administered medications on file prior to visit.    Review of Systems Pertinent items noted in HPI and remainder of comprehensive ROS otherwise negative. Physical Exam:   Vitals:   07/15/19 0901  BP: 107/68  Pulse: 78  Temp: 98.1 F (36.7 C)  Weight: 113 lb (51.3 kg)   Fetal Heart Rate (bpm): 159 Uterus:    c/w 12 wk size  Pelvic Exam: Perineum: no hemorrhoids, normal perineum   Vulva: normal external genitalia, no lesions   Vagina:  normal mucosa, normal discharge   Cervix: no lesions  and normal, pap smear done.    Adnexa: normal adnexa and no mass, fullness, tenderness   Bony Pelvis: average  System: General: well-developed, well-nourished female in no acute distress   Breasts:  pt declines exam   Skin: normal coloration and turgor, no rashes   Neurologic: oriented, normal, negative, normal mood   Extremities: normal strength, tone, and muscle mass, ROM of all joints is normal   HEENT PERRLA, extraocular movement intact and sclera clear, anicteric   Mouth/Teeth mucous membranes moist, pharynx normal without lesions and dental hygiene good   Neck supple and no masses   Cardiovascular: regular rate and rhythm   Respiratory:  no respiratory distress, normal breath sounds   Abdomen: soft, non-tender; bowel sounds normal; no masses,  no organomegaly     Assessment:    Pregnancy: [id number] Patient Active Problem List   Diagnosis Date Noted  . Supervision of other normal pregnancy, antepartum 06/30/2019  . Pregnancy of unknown anatomic location 05/19/2019  . Foster care (status) 09/29/2015     Plan:    1. Supervision of other normal pregnancy, antepartum - anatomy scan ordered - Obstetric Panel, Including HIV - Culture, OB Urine - Hepatitis C Antibody -  Cytology - PAP( [place]) - Cervicovaginal ancillary only( [place]) - Genetic Screening   Initial labs drawn. Continue prenatal vitamins. Genetic Screening discussed, NIPS: requested. Ultrasound discussed; fetal anatomic survey: requested. Problem list reviewed and updated. The nature of [place] with multiple MDs and other Advanced Practice Providers was explained to patient; also emphasized that residents, students are part of our team. Routine obstetric precautions reviewed. Please see After Visit Summary for additional counseling. Return in about 4 weeks (around 08/12/2019) for in-person ROB/AFP, needs anatomy scan 18-20 weeks.     Clarisa Fling, NP  9:41  AM 07/15/2019

## 2019-07-16 LAB — OBSTETRIC PANEL, INCLUDING HIV
Antibody Screen: NEGATIVE
Basophils Absolute: 0.1 10*3/uL (ref 0.0–0.2)
Basos: 1 %
EOS (ABSOLUTE): 0.1 10*3/uL (ref 0.0–0.4)
Eos: 1 %
HIV Screen 4th Generation wRfx: NONREACTIVE
Hematocrit: 41.5 % (ref 34.0–46.6)
Hemoglobin: 13.7 g/dL (ref 11.1–15.9)
Hepatitis B Surface Ag: NEGATIVE
Immature Grans (Abs): 0 10*3/uL (ref 0.0–0.1)
Immature Granulocytes: 0 %
Lymphocytes Absolute: 2 10*3/uL (ref 0.7–3.1)
Lymphs: 22 %
MCH: 27.7 pg (ref 26.6–33.0)
MCHC: 33 g/dL (ref 31.5–35.7)
MCV: 84 fL (ref 79–97)
Monocytes Absolute: 0.5 10*3/uL (ref 0.1–0.9)
Monocytes: 5 %
Neutrophils Absolute: 6.5 10*3/uL (ref 1.4–7.0)
Neutrophils: 71 %
Platelets: 375 10*3/uL (ref 150–450)
RBC: 4.94 x10E6/uL (ref 3.77–5.28)
RDW: 14.9 % (ref 11.7–15.4)
RPR Ser Ql: NONREACTIVE
Rh Factor: NEGATIVE
Rubella Antibodies, IGG: 2.98 index (ref >0.99)
WBC: 9.2 10*3/uL (ref 3.4–10.8)

## 2019-07-16 LAB — CERVICOVAGINAL ANCILLARY ONLY
Chlamydia: NEGATIVE
Comment: NEGATIVE
Comment: NORMAL
Neisseria Gonorrhea: NEGATIVE

## 2019-07-16 LAB — CYTOLOGY - PAP: Diagnosis: NEGATIVE

## 2019-07-16 LAB — HEPATITIS C ANTIBODY: Hep C Virus Ab: <0.1 s/co ratio (ref 0.0–0.9)

## 2019-07-19 LAB — URINE CULTURE, OB REFLEX

## 2019-07-19 LAB — CULTURE, OB URINE

## 2019-07-23 ENCOUNTER — Other Ambulatory Visit: Payer: Self-pay | Admitting: Women's Health

## 2019-07-23 ENCOUNTER — Telehealth: Payer: Self-pay | Admitting: *Deleted

## 2019-07-23 ENCOUNTER — Encounter: Payer: Self-pay | Admitting: Women's Health

## 2019-07-23 ENCOUNTER — Encounter: Payer: Self-pay | Admitting: General Practice

## 2019-07-23 DIAGNOSIS — Q999 Chromosomal abnormality, unspecified: Secondary | ICD-10-CM

## 2019-07-23 NOTE — Telephone Encounter (Signed)
[place] Genetic Counselor called to report abnormal Panorama result. High Risk due to Fetal DNA infraction. Insufficient fetal DNA in the blood sample. High risk for  Triploidy, trisomy 37 or 74. Recommend genetic counseling, ultrasound or diagnostic testing. A redraw can also be obtained. Please call with questions [phone].  Clovis Pu, RN

## 2019-07-23 NOTE — Telephone Encounter (Signed)
Thank you for letting me know. The patient definitely needs to repeat the test so we can obtain other results that were not able to be obtained d/t low fetal fraction. Patient also needs a referral to MFM and genetic counseling for evaluation, preferably after NIPS is redrawn. Please call patient to make her aware and assist with setting up appointments for genetic counseling and MFM. I will enter orders for these. Thank you, Joni Reining

## 2019-07-28 ENCOUNTER — Encounter: Payer: Self-pay | Admitting: General Practice

## 2019-07-30 ENCOUNTER — Encounter: Payer: Self-pay | Admitting: General Practice

## 2019-08-04 ENCOUNTER — Ambulatory Visit: Payer: Self-pay

## 2019-08-13 ENCOUNTER — Encounter: Payer: Self-pay | Admitting: Obstetrics and Gynecology

## 2019-08-13 ENCOUNTER — Ambulatory Visit (INDEPENDENT_AMBULATORY_CARE_PROVIDER_SITE_OTHER): Payer: Self-pay | Admitting: Obstetrics and Gynecology

## 2019-08-13 ENCOUNTER — Other Ambulatory Visit: Payer: Self-pay

## 2019-08-13 ENCOUNTER — Other Ambulatory Visit: Payer: Self-pay | Admitting: Obstetrics and Gynecology

## 2019-08-13 VITALS — BP 108/67 | HR 68 | Wt 117.0 lb

## 2019-08-13 DIAGNOSIS — Z3482 Encounter for supervision of other normal pregnancy, second trimester: Secondary | ICD-10-CM

## 2019-08-13 DIAGNOSIS — Z3480 Encounter for supervision of other normal pregnancy, unspecified trimester: Secondary | ICD-10-CM

## 2019-08-13 DIAGNOSIS — Z3A16 16 weeks gestation of pregnancy: Secondary | ICD-10-CM

## 2019-08-13 MED ORDER — CYCLOBENZAPRINE HCL 10 MG PO TABS: 10.0000 mg | ORAL_TABLET | Freq: Three times a day (TID) | ORAL | 1 refills | Status: DC | PRN

## 2019-08-13 NOTE — Progress Notes (Signed)
Patient reports difficulty with migraines for the past week- not consistently happening. Does report dizziness and blurry vision. Patient does not have BP cuff at home.

## 2019-08-13 NOTE — Progress Notes (Signed)
   PRENATAL VISIT NOTE  Subjective:  Sierra Shannon is a 24 y.o. G3P0020 at [redacted]w[redacted]d being seen today for ongoing prenatal care.  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  Patient reports headache.  Contractions: Not present. Vag. Bleeding: None.  Movement: Absent. Denies leaking of fluid. Transfer of care from [employer]   The following portions of the patient's history were reviewed and updated as appropriate: allergies, current medications, past family history, past medical history, past social history, past surgical history and problem list.   Objective:   Vitals:   08/13/19 1126  BP: 108/67  Pulse: 68  Weight: 117 lb (53.1 kg)    Fetal Status: Fetal Heart Rate (bpm): 149   Movement: Absent     General:  Alert, oriented and cooperative. Patient is in no acute distress.  Skin: Skin is warm and dry. No rash noted.   Cardiovascular: Normal heart rate noted  Respiratory: Normal respiratory effort, no problems with respiration noted  Abdomen: Soft, gravid, appropriate for gestational age.  Pain/Pressure: Present     Pelvic: Cervical exam deferred        Extremities: Normal range of motion.  Edema: None  Mental Status: Normal mood and affect. Normal behavior. Normal judgment and thought content.   Assessment and Plan:  Pregnancy: G3P0020 at [redacted]w[redacted]d 1. Supervision of other normal pregnancy, antepartum  - [facility] AMB BABYSCRIPTS SCHEDULE OPTIMIZATION - 1000 mg of extra strength tylenol as directed on the bottle,  & Flexeril for headaches.  - [place] scheduled for 6/2 - NIPS redrawn today d/t low fetal fetal fraction, high risk triploidy - If needed, will refer to genetic counselor after results.  - AFP today    Preterm labor symptoms and general obstetric precautions including  but not limited to vaginal bleeding, contractions, leaking of fluid and fetal movement were reviewed in detail with the patient. Please refer to After Visit Summary for other counseling recommendations.   Return in about 4 weeks (around 09/10/2019) for In person visit. .  Future Appointments  Date Time Provider Department Center  08/19/2019  1:45 PM [facility] US5 [facility]  08/19/2019  2:30 PM [facility] GENETIC COUNSELING RM [facility]  08/19/2019  3:30 PM [facility] MD RM [facility]  09/10/2019  1:35 PM Morissa Obeirne, Harolyn Rutherford, NP [facility]    Venia Carbon, NP

## 2019-08-15 LAB — AFP, SERUM, OPEN SPINA BIFIDA
AFP MoM: 1.08
AFP Value: 48.1 ng/mL
Gest. Age on Collection Date: 16.7 weeks
Maternal Age At EDD: 22.2 yr
OSBR Risk 1 IN: 9540
Test Results:: NEGATIVE
Weight: 117 [lb_av]

## 2019-08-18 ENCOUNTER — Other Ambulatory Visit: Payer: Self-pay | Admitting: Obstetrics and Gynecology

## 2019-08-19 ENCOUNTER — Encounter: Payer: Self-pay | Admitting: *Deleted

## 2019-08-19 ENCOUNTER — Ambulatory Visit: Payer: Self-pay | Admitting: *Deleted

## 2019-08-19 ENCOUNTER — Ambulatory Visit: Payer: Self-pay

## 2019-08-19 ENCOUNTER — Other Ambulatory Visit: Payer: Self-pay

## 2019-08-19 ENCOUNTER — Other Ambulatory Visit: Payer: Self-pay | Admitting: *Deleted

## 2019-08-19 ENCOUNTER — Ambulatory Visit: Payer: Self-pay | Attending: Women's Health

## 2019-08-19 DIAGNOSIS — Z362 Encounter for other antenatal screening follow-up: Secondary | ICD-10-CM

## 2019-08-19 DIAGNOSIS — Z3480 Encounter for supervision of other normal pregnancy, unspecified trimester: Secondary | ICD-10-CM

## 2019-08-19 DIAGNOSIS — O289 Unspecified abnormal findings on antenatal screening of mother: Secondary | ICD-10-CM

## 2019-08-19 DIAGNOSIS — Q999 Chromosomal abnormality, unspecified: Secondary | ICD-10-CM

## 2019-08-19 DIAGNOSIS — O3680X0 Pregnancy with inconclusive fetal viability, not applicable or unspecified: Secondary | ICD-10-CM | POA: Insufficient documentation

## 2019-08-19 DIAGNOSIS — Z363 Encounter for antenatal screening for malformations: Secondary | ICD-10-CM

## 2019-08-19 DIAGNOSIS — Z3A17 17 weeks gestation of pregnancy: Secondary | ICD-10-CM

## 2019-09-02 ENCOUNTER — Ambulatory Visit: Payer: Self-pay

## 2019-09-10 ENCOUNTER — Other Ambulatory Visit: Payer: Self-pay

## 2019-09-10 ENCOUNTER — Ambulatory Visit (INDEPENDENT_AMBULATORY_CARE_PROVIDER_SITE_OTHER): Payer: Self-pay | Admitting: Obstetrics and Gynecology

## 2019-09-10 VITALS — BP 114/69 | HR 73 | Wt 120.4 lb

## 2019-09-10 DIAGNOSIS — Z3A20 20 weeks gestation of pregnancy: Secondary | ICD-10-CM

## 2019-09-10 DIAGNOSIS — Z3480 Encounter for supervision of other normal pregnancy, unspecified trimester: Secondary | ICD-10-CM

## 2019-09-10 DIAGNOSIS — O26892 Other specified pregnancy related conditions, second trimester: Secondary | ICD-10-CM

## 2019-09-10 DIAGNOSIS — R519 Headache, unspecified: Secondary | ICD-10-CM

## 2019-09-10 DIAGNOSIS — G8929 Other chronic pain: Secondary | ICD-10-CM

## 2019-09-10 MED ORDER — ACETAMINOPHEN 500 MG PO TABS
1000.0000 mg | ORAL_TABLET | Freq: Four times a day (QID) | ORAL | 0 refills | Status: AC | PRN
Start: 2019-09-10 — End: ?

## 2019-09-10 MED ORDER — CYCLOBENZAPRINE HCL 10 MG PO TABS
10.0000 mg | ORAL_TABLET | Freq: Three times a day (TID) | ORAL | 1 refills | Status: DC | PRN
Start: 2019-09-10 — End: 2019-10-08

## 2019-09-10 NOTE — Progress Notes (Signed)
   PRENATAL VISIT NOTE  Subjective:  Sierra Shannon is a 24 y.o. G3P0020 at [redacted]w[redacted]d being seen today for ongoing prenatal care.  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  Patient reports no complaints.  Contractions: Not present. Vag. Bleeding: None.  Movement: Present. Denies leaking of fluid.   The following portions of the patient's history were reviewed and updated as appropriate: allergies, current medications, past family history, past medical history, past social history, past surgical history and problem list.   Objective:   Vitals:   09/10/19 1332  BP: 114/69  Pulse: 73  Weight: 120 lb 6.4 oz (54.6 kg)    Fetal Status: Fetal Heart Rate (bpm): 154 Fundal Height: 18 cm Movement: Present     General:  Alert, oriented and cooperative. Patient is in no acute distress.  Skin: Skin is warm and dry. No rash noted.   Cardiovascular: Normal heart rate noted  Respiratory: Normal respiratory effort, no problems with respiration noted  Abdomen: Soft, gravid, appropriate for gestational age.  Pain/Pressure: Absent     Pelvic: Cervical exam deferred        Extremities: Normal range of motion.  Edema: None  Mental Status: Normal mood and affect. Normal behavior. Normal judgment and thought content.   Assessment and Plan:  Pregnancy: G3P0020 at [redacted]w[redacted]d  1. Supervision of other normal pregnancy, antepartum  Doing well Continues to having off and on HA- I'll Resent tylenol and flexeril Patient forgot to pick Rx up  May need to see HA specialist  Discussed [facility] for [facility] and circumcision   2. Chronic nonintractable headache, unspecified headache type    Preterm labor symptoms and general obstetric precautions including but not limited to vaginal bleeding, contractions, leaking of fluid  and fetal movement were reviewed in detail with the patient. Please refer to After Visit Summary for other counseling recommendations.   No follow-ups on file.  Future Appointments  Date Time Provider Department Center  09/16/2019  2:30 PM [facility] US3 [facility]    Venia Carbon, NP

## 2019-09-10 NOTE — Patient Instructions (Signed)
Rh Incompatibility Rh incompatibility is a condition that occurs during pregnancy if a woman has Rh-negative blood and her baby has Rh-positive blood. "Rh-negative" and "Rh-positive" refer to whether or not the blood has a specific protein found on the surface of red blood cells (Rh factor). If a woman has Rh factor, she is Rh-positive. If she does not have an Rh factor, she is Rh-negative. Having or not having an Rh factor does not affect the mother's general health. However, it can cause problems during pregnancy. What kind of problems can Rh incompatibility cause? During pregnancy, blood from the baby can cross into the mother's bloodstream, especially during delivery. If a mother is Rh-negative and the baby is Rh-positive, the mother's defense (immune) system will react to the baby's blood as if it were a foreign substance and will create certain proteins (antibodies) in response to it. This process is called sensitization. Once the mother is sensitized, her Rh antibodies will cross the placenta in future pregnancies and attack the baby's Rh-positive blood as if it were a harmful substance. Rh incompatibility can also happen if a pregnant woman who is Rh-negative receives a donation (transfusion) of Rh-positive blood. How does this condition affect my baby? The Rh antibodies that attack and destroy your baby's red blood cells can lead to hemolytic disease in the baby. This is a condition in which red blood cells break down. This can cause:  Yellowing of the skin and the whites of the eyes (jaundice).  Fewer red blood cells in the body (anemia).  Brain damage.  Heart failure.  Death. These antibodies usually do not cause problems during a woman's first pregnancy. This is because blood from the baby often crosses into the mother's bloodstream during delivery, and the baby is born before many of the antibodies can develop. However, once antibodies have formed, they stay in a woman's body. Because  of this, Rh incompatibility is more likely to cause problems in second or later pregnancies (if the baby is Rh-positive). How is this diagnosed?  When you become pregnant, you may have blood tests to determine your blood type and Rh factor. If you are Rh-negative, you may have another blood test called an antibody screen. The antibody screen shows whether you have Rh antibodies in your blood. If you do, it may mean that you have been exposed to Rh-positive blood before, and that you have a risk for Rh incompatibility. To find out whether your baby is developing hemolytic anemia and how serious it is, health care providers may use more advanced tests, such as ultrasound. How is Rh incompatibility treated? This condition is treated with two shots (injections) of medicine called Rho (D) immune globulin. This medicine keeps your body from making antibodies that can cause serious problems for your baby or for future babies. You will get one shot around your seventh month of pregnancy and the other within 72 hours of your baby's birth. If you are Rh-negative, you will need this medicine every time you have a baby with Rh-positive blood. If you are Rh-negative and there is a high risk of blood transfer between you and your baby, you may be given Rho (D) immune globulin. The risk of blood transfer is high if you experience:  Amniocentesis. This is a procedure to remove a small amount of the fluid that surrounds a baby in the uterus (amniotic fluid) so that it can be tested.  A miscarriage or an abortion.  An ectopic pregnancy. This is a pregnancy in  which the fertilized egg attaches (implants) outside the uterus.  Any vaginal bleeding during pregnancy. If you already have antibodies in your blood, your pregnancy will be closely monitored. You will not be given Rho (D) immune globulin because it is not effective in these cases. Summary  Rh incompatibility is a condition that occurs during pregnancy if a  woman has Rh-negative blood and her baby has Rh-positive blood.  If a mother is Rh-negative and the baby is Rh-positive, the mother's immune system will react to the baby's blood as if it were a foreign substance, and will create antibodies.  Rh antibodies that attack and destroy the baby's red blood cells can lead to hemolytic disease in the baby.  This condition is treated with a shot of medicine called Rho (D) immune globulin. This medicine keeps the woman's body from making antibodies that can cause serious problems in the baby or future babies. This information is not intended to replace advice given to you by your health care provider. Make sure you discuss any questions you have with your health care provider. Document Revised: 02/15/2017 Document Reviewed: 05/01/2016 Elsevier Patient Education  2020 [employer].   Deciding about Circumcision in Baby Boys  (The Basics)  What is circumcision?  Circumcision is a surgery that removes the skin that covers the tip of the penis, called the "foreskin" Circumcision is usually done when a boy is between 23 and 64 days old. In the [place], circumcision is common. In some other countries, fewer boys are circumcised. Circumcision is a common tradition in some religions.  Should I have my baby boy circumcised?  There is no easy answer. Circumcision has some benefits. But it also has risks. After talking with your doctor, you will have to decide for yourself what is right for your family.  What are the benefits of circumcision?  Circumcised boys seem to have slightly lower rates of: ?Urinary tract infections ?Swelling of the opening at the tip of the penis Circumcised men seem to have slightly lower rates of: ?Urinary tract infections ?Swelling of the opening at the tip of the penis ?Penis cancer ?HIV and other infections that you catch during sex ?Cervical cancer in the women they have sex with Even so, in the [place], the  risks of these problems are small - even in boys and men who have not been circumcised. Plus, boys and men who are not circumcised can reduce these extra risks by: ?Cleaning their penis well ?Using condoms during sex  What are the risks of circumcision?  Risks include: ?Bleeding or infection from the surgery ?Damage to or amputation of the penis ?A chance that the doctor will cut off too much or not enough of the foreskin ?A chance that sex won't feel as good later in life Only about 1 out of every 200 circumcisions leads to problems. There is also a chance that your health insurance won't pay for circumcision.  How is circumcision done in baby boys?  First, the baby gets medicine for pain relief. This might be a cream on the skin or a shot into the base of the penis. Next, the doctor cleans the baby's penis well. Then he or she uses special tools to cut off the foreskin. Finally, the doctor wraps a bandage (called gauze) around the baby's penis. If you have your baby circumcised, his doctor or nurse will give you instructions on how to care for him after the surgery. It is important that you follow those  instructions carefully.

## 2019-09-16 ENCOUNTER — Ambulatory Visit: Payer: Self-pay | Attending: Obstetrics and Gynecology

## 2019-09-16 ENCOUNTER — Other Ambulatory Visit: Payer: Self-pay

## 2019-09-16 DIAGNOSIS — Z362 Encounter for other antenatal screening follow-up: Secondary | ICD-10-CM | POA: Insufficient documentation

## 2019-09-16 DIAGNOSIS — O289 Unspecified abnormal findings on antenatal screening of mother: Secondary | ICD-10-CM

## 2019-09-16 DIAGNOSIS — Z3A21 21 weeks gestation of pregnancy: Secondary | ICD-10-CM

## 2019-10-08 ENCOUNTER — Encounter: Payer: Self-pay | Admitting: Medical

## 2019-10-08 ENCOUNTER — Other Ambulatory Visit: Payer: Self-pay

## 2019-10-08 ENCOUNTER — Ambulatory Visit (INDEPENDENT_AMBULATORY_CARE_PROVIDER_SITE_OTHER): Payer: Self-pay | Admitting: Medical

## 2019-10-08 VITALS — BP 114/68 | HR 68 | Wt 127.0 lb

## 2019-10-08 DIAGNOSIS — Z3482 Encounter for supervision of other normal pregnancy, second trimester: Secondary | ICD-10-CM

## 2019-10-08 DIAGNOSIS — Z3A24 24 weeks gestation of pregnancy: Secondary | ICD-10-CM

## 2019-10-08 DIAGNOSIS — Z3480 Encounter for supervision of other normal pregnancy, unspecified trimester: Secondary | ICD-10-CM

## 2019-10-08 NOTE — Patient Instructions (Addendum)
Second Trimester of Pregnancy  The second trimester is from week 14 through week 27 (month 4 through 6). This is often the time in pregnancy that you feel your best. Often times, morning sickness has lessened or quit. You may have more energy, and you may get hungry more often. Your unborn baby is growing rapidly. At the end of the sixth month, he or she is about 9 inches long and weighs about 1 pounds. You will likely feel the baby move between 18 and 20 weeks of pregnancy. Follow these instructions at home: Medicines  Take over-the-counter and prescription medicines only as told by your doctor. Some medicines are safe and some medicines are not safe during pregnancy.  Take a prenatal vitamin that contains at least 600 micrograms (mcg) of folic acid.  If you have trouble pooping (constipation), take medicine that will make your stool soft (stool softener) if your doctor approves. Eating and drinking   Eat regular, healthy meals.  Avoid raw meat and uncooked cheese.  If you get low calcium from the food you eat, talk to your doctor about taking a daily calcium supplement.  Avoid foods that are high in fat and sugars, such as fried and sweet foods.  If you feel sick to your stomach (nauseous) or throw up (vomit): ? Eat 4 or 5 small meals a day instead of 3 large meals. ? Try eating a few soda crackers. ? Drink liquids between meals instead of during meals.  To prevent constipation: ? Eat foods that are high in fiber, like fresh fruits and vegetables, whole grains, and beans. ? Drink enough fluids to keep your pee (urine) clear or pale yellow. Activity  Exercise only as told by your doctor. Stop exercising if you start to have cramps.  Do not exercise if it is too hot, too humid, or if you are in a place of great height (high altitude).  Avoid heavy lifting.  Wear low-heeled shoes. Sit and stand up straight.  You can continue to have sex unless your doctor tells you not  to. Relieving pain and discomfort  Wear a good support bra if your breasts are tender.  Take warm water baths (sitz baths) to soothe pain or discomfort caused by hemorrhoids. Use hemorrhoid cream if your doctor approves.  Rest with your legs raised if you have leg cramps or low back pain.  If you develop puffy, bulging veins (varicose veins) in your legs: ? Wear support hose or compression stockings as told by your doctor. ? Raise (elevate) your feet for 15 minutes, 3-4 times a day. ? Limit salt in your food. Prenatal care  Write down your questions. Take them to your prenatal visits.  Keep all your prenatal visits as told by your doctor. This is important. Safety  Wear your seat belt when driving.  Make a list of emergency phone numbers, including numbers for family, friends, the hospital, and police and fire departments. General instructions  Ask your doctor about the right foods to eat or for help finding a counselor, if you need these services.  Ask your doctor about local prenatal classes. Begin classes before month 6 of your pregnancy.  Do not use hot tubs, steam rooms, or saunas.  Do not douche or use tampons or scented sanitary pads.  Do not cross your legs for long periods of time.  Visit your dentist if you have not done so. Use a soft toothbrush to brush your teeth. Floss gently.  Avoid all smoking, herbs,   and alcohol. Avoid drugs that are not approved by your doctor.  Do not use any products that contain nicotine or tobacco, such as cigarettes and e-cigarettes. If you need help quitting, ask your doctor.  Avoid cat litter boxes and soil used by cats. These carry germs that can cause birth defects in the baby and can cause a loss of your baby (miscarriage) or stillbirth. Contact a doctor if:  You have mild cramps or pressure in your lower belly.  You have pain when you pee (urinate).  You have bad smelling fluid coming from your vagina.  You continue to  feel sick to your stomach (nauseous), throw up (vomit), or have watery poop (diarrhea).  You have a nagging pain in your belly area.  You feel dizzy. Get help right away if:  You have a fever.  You are leaking fluid from your vagina.  You have spotting or bleeding from your vagina.  You have severe belly cramping or pain.  You lose or gain weight rapidly.  You have trouble catching your breath and have chest pain.  You notice sudden or extreme puffiness (swelling) of your face, hands, ankles, feet, or legs.  You have not felt the baby move in over an hour.  You have severe headaches that do not go away when you take medicine.  You have trouble seeing. Summary  The second trimester is from week 14 through week 27 (months 4 through 6). This is often the time in pregnancy that you feel your best.  To take care of yourself and your unborn baby, you will need to eat healthy meals, take medicines only if your doctor tells you to do so, and do activities that are safe for you and your baby.  Call your doctor if you get sick or if you notice anything unusual about your pregnancy. Also, call your doctor if you need help with the right food to eat, or if you want to know what activities are safe for you. This information is not intended to replace advice given to you by your health care provider. Make sure you discuss any questions you have with your health care provider. Document Revised: 06/27/2018 Document Reviewed: 04/10/2016 Elsevier Patient Education  2020 [employer] [phone]) . [facility] Davy Pique, MD; Gwendlyn Deutscher, MD; Walker Kehr, MD; Andria Frames, MD; McDiarmid, MD; Dutch Quint, MD; Nori Riis, MD; Mingo [address], [address] [place], [place] [address] o [phone] o Mon-Fri 8:30-12:30, 1:30-5:00 o Providers come to see babies at [facility] o Accepting Medicaid . [place]  [facility] [place] providers who accept newborns: Dorthy Cooler, MD; Orland Mustard, MD; Stephanie Acre, MD o [place], [place], [place] [address] o [phone] o Mon-Fri 8:00-5:30 o Babies seen by providers at [facility] o Does NOT accept Medicaid o Please call early in hospitalization for appointment (limited availability)  . Mustard Gallatin, MD o [address], [place], [place] [address] o [phone] o Mon, Tue, Thur, Fri 8:30-5:00, Wed 10:00-7:00 (closed 1-2pm) o Babies seen by [facility] providers o Accepting Medicaid . [place], MD o [place], [place], [place] [address] o [phone] o Mon-Fri 8:30-5:00, Sat 8:30-12:00 o Provider comes to see babies at [place] Medicaid o Must have been referred from current patients or contacted office prior to delivery . [facility] (  [facility]) o Brown, MD; Chandler, MD; Ettefagh, MD; Grant, MD; Lester, MD; McCormick, MD; McQueen, MD; Prose, MD; Simha, MD; Stanley, MD; Stryffeler, NP; Tebben, NP o [address], [place], [place] [address] o [phone] o Mon, Tue, Thur, Fri 8:30-5:30, Wed 9:30-5:30, Sat 8:30-12:30 o Babies seen by [facility] providers o Accepting Medicaid o Only accepting infants of first-time parents or siblings of current patients o Hospital discharge coordinator will make follow-up appointment . Jack Amos o [address], [place], [place]  [address] o [phone]   Fax - [phone] . [facility] o [address], [place], [place]  [address] o Phone - [phone]   Fax - [phone] . Shilpa Gosrani o [address], [place], [place]  [address] o [phone]  [place] ([address]) . [facility] o Bates, MD; [place], MD; Cooper, Cox, MD; MD; Davis, MD; Dovico, MD;  Ettefaugh, MD; Little, MD; Lowe, MD; Keiffer, MD; Melvin, MD; Sumner, MD; Williams, MD o [address], [place], [place] [address] o [phone] o Mon-Fri 8:30-5:00 (extended evenings Mon-Thur as needed), Sat-Sun 10:00-1:00 o Providers come to see babies at [facility] o Accepting Medicaid for families of first-time babies and families with all children in the household age 3 and under. Must register with office prior to making appointment (M-F only). . [facility] o Henson, NP; Knapp, MD; Lalonde, MD; Tysinger, PA o [address] [place], [place] [address] o [phone] o Mon-Fri 8:00-5:00 o Babies seen by providers at [facility] o Does NOT accept Medicaid/Commercial Insurance Only . [facility] [place] ([facility])  o Artis, MD; Barnes, MD; Bratton, MD; Coccaro, MD; [place] Gardner, MD; Kramer, MD; Marshall, MD; Netherton, MD; Poleto, MD; Skinner, MD o [address] [place], [place] [address] o [phone] o Mon-Fri 8:30-5:30, Sat (Oct.-Mar.) 9:00-1:00 o Babies seen by providers at [facility] o Accepting Medicaid  [place] ([address]) . [facility] o Reid, MD; Warner, MD o [address], [place], [place] [address] o [phone] o Mon-Fri 8:30-5:00, Sat 8:30-12:00 o Providers come to see babies at [facility] o Does NOT accept Medicaid . [facility] o Becker, PA; Hagler, MD; Scifres, PA; Sun, MD; Swayne, MD o [address], [place], [place] [address] o [phone] o Mon-Fri 8:00-5:00 o Babies seen by providers at [facility] o Does NOT accept Medicaid o Only accepting babies of parents who are patients o Please call early in hospitalization for appointment (limited availability) . [facility] o Clark, MD; Frye, MD; Kelleher, MD; Mack, NP; Miller, MD; O'Keller, MD; Patterson, NP; Pudlo, MD; Puzio, MD; Thomas, MD;  Tucker, MD; Twiselton, MD o [address], [place], [place] [address] o [phone] o Mon-Fri 8:00-5:00, Sat 9:00-12:00 o Providers come to see babies at [facility] o Does NOT accept Medicaid  [place] ([address]) . [facility] [employer] o Limited providers accepting new patients: Brake, NP; Wharton, PA o [address], [place], [place] [address] o [phone] o Mon-Fri 8:00-5:00 o Babies seen by providers at [facility] o Does NOT accept Medicaid o Only accepting babies of parents who are patients o Please call early in hospitalization for appointment (limited availability) . [facility] o Gay, MD; Quinlan, MD o [address] ,  [address] o [phone] (press 1 to schedule appointment) o Mon-Fri 8:00-5:00 o Providers come to   see babies at [facility] o Does NOT accept Medicaid . [facility] o Mazer, MD o [address] [place], [place] [address] o [phone] o Mon-Fri 8:30-5:00 (lunch 12:30-1:00), extended hours by appointment only Wed 5:00-6:30 o Babies seen by [facility] providers o Accepting Medicaid . [facility] o Banks, MD; Jordan, MD; Koberlein, MD o [address], [place], [place] [address] o [phone] o Mon-Fri 8:00-5:00 o Babies seen by [facility] providers o Does NOT accept Medicaid . [facility] o Parker, MD; Hunter, MD; Wallace, DO o [address] [place], [place] [address] o [phone] o Mon-Fri 8:00-5:00 o Babies seen by [facility] providers o Does NOT accept Medicaid . [facility] o Brandon, PA; Brecken, PA; Christy, NP; Dees, MD; DeClaire, MD; DeWeese, MD; Hansen, NP; Mills, NP; Parrish, NP; Smoot, NP; Summer, MD; Vapne, MD o [address] [place], [place] [address] o [phone] o Mon-Fri 8:30-5:00, Sat 10:00-1:00 o Providers come to see babies at [facility] o Does NOT accept Medicaid o Free prenatal information session Tuesdays at 4:45pm . [facility] o Bouska, MD; Gordon, PA; Jeffery, PA; Weber, PA o [address] [place] [address] o [phone] o Mon-Fri 7:30-5:30 o Babies seen by [facility] providers . [facility] o [address], [place], [place]  [address] o [phone]   Fax - [phone]  [place] ([address]) . [facility] o [place] o [address] [place], [place] [address] o [phone] o Mon-Thur 8:00-6:00 o Providers come to see babies at [facility] o Accepting Medicaid . [facility] o Anderson, NP; Badger, MD; Beal, PA; Spencer, PA o [address] [place], [place] [address] o [phone] o Mon-Thur 7:30-7:30, Fri 7:30-4:30 o Babies seen by [facility] providers o Accepting Medicaid . [facility] o Agbuya, MD; Klett, NP; Romgoolam, MD o [address], [place], [place] [address] o [phone] o Mon-Fri 8:30-5:00, Sat 8:30-12:00 o Providers come to see babies at [facility] o Accepting Medicaid o Must have "Meet & Greet" appointment at office prior to delivery . [facility] ([facility] o McCord, MD; Wallace, MD; Wood, MD o [address], [place], [place] [address] o [phone] o Mon-Wed 8:00-6:00, Thur-Fri 8:00-5:00, Sat 9:00-12:00 o Providers come to see babies at [facility] o Does NOT accept Medicaid o Only accepting siblings of current patients . [facility]  o [address], [place], [place]  [address] o [phone]   Fax - [phone] . [facility] o [address], [place], [place]  [address] o [phone]   Fax - [phone]  [place] ([address]) . [facility] [facility] o Cirigliano, DO; Matthews, DO o [address] [place], [place] [address] o [phone] o Mon-Fri 7:00-5:00 o Babies seen by [facility] providers o Does NOT accept Medicaid . [facility] o Briscoe, MD; Howley, PA; [place], PA o [address], [place], [place] [address] o [phone] o Mon-Fri 8:00-5:00 o Babies seen by [facility] providers o Accepting Medicaid . [facility] o Boyd, MD; Church, PA; Jones, NP; Osborn, PA o [address], [place], [place] [address] o [phone] o Mon-Fri 8:00-5:00 o Babies seen by providers at [facility] o   Accepting Medicaid  [place] ([address]) . [facility] [place] o [place], DO o [address] [place], [place] [address] o [phone] o Mon-Fri 8:00-5:00 o Babies seen by [facility] providers o Does NOT accept Medicaid o Limited availability, please call early in hospitalization to schedule follow-up . [facility] o Calderon, PA; Cummings, MD; Dillard, MD; Martin, PA; Olson, MD; VanDeven, PA o [address], [place], [place] [address] o [phone] o Mon-Fri 8:30-5:00, Sat 9:00-12:00 o Babies seen by providers at [facility] o Accepting Medicaid o Please register online then schedule online or call office o [email or web address] . [facility] ([facility]) o Hunter, NP; Kumar, MD; [place] Rogers, PA o [address], [place], [place] [address] o [phone] o Mon-Fri 8:00-5:00 o Babies seen by providers at [facility] o Accepting Medicaid . [facility] ([facility]) o Capitola, MD; Kristi Fleenor, NP; [place], MD o [address], [place], [place] [address] o [phone] o Mon-Fri 8:00-5:30, Sat&Sun by  appointment (phones open at 8:30) o Babies seen by [facility] providers o Accepting Medicaid o Must be a first-time baby or sibling of current patient . [facility] [place]  o [address], [place], [place]  [address] o [phone]   Fax - [phone]  [place] ([address]) . [facility] o Brown, PA; Cowen, PA; Rice, MD; Helton, PA; Spry, MD o [address] [place], [place] [address] o [phone] o Mon-Thur 8:00-7:00, Fri 8:00-5:00, Sat 8:00-12:00, Sun 9:00-12:00 o Babies seen by [facility] providers o Accepting Medicaid . [facility] [place] o Coe-Goins, MD; Marshall, MD; Pierre-Louis, MD o [address] [place] [address], [place], [place] [address] o [phone] o Mon-Thur 8:00-5:00 o Babies seen by providers at [facility] o Accepting Medicaid . [facility] o Bratton, MD; Coe-Goins, MD; Hayes, MD; Lewis, MD; List, MD; Lott, MD; Marshall, MD; Moran, MD; O'Neal, MD; Pierre-Louis, MD; Pitonzo, MD; Scholer, MD; Spangle, MD o [address] [place], [place] [address] o [phone] o Mon-Fri 8:00-5:30, Sat (Oct.-Mar.) 9:00-1:00 o Babies seen by providers at [facility] o Accepting Medicaid o Must fill out new patient packet, available online at [email or web address] . [facility] ([facility]) o Friddle, NP; Harris, NP; Kelly, NP; Logan, MD; Melvin, PA; Poth, MD; Ramadoss, MD; Stanton, NP o [address], [place], [place] [address] o [phone] o Mon-Thur 8:00-5:30, Fri 8:00-5:00 o Babies seen by providers at [facility] o Accepting Medicaid  [place] ([address]) . [facility] o Dixon, PA; Mead, MD; Pickard, MD; Tapia, PA o [address], [place], [place] [address] o [phone] o Mon-Fri 8:00-5:00 o Babies seen  by providers at [facility] o Accepting Medicaid   [place] ([address]) . [facility] [place] o Masneri, DO; Meyers, MD; Nelson, PA o [address], [place], [place] [address] o [phone] o Mon-Fri 8:00-5:00 o Babies seen by providers at [facility] o Does NOT accept Medicaid o Limited appointment availability, please call early in hospitalization  . [facility] [place] o Kunedd, DO; McGowen, MD o [address], [place],  [address] o [phone] o   Mon-Fri 8:00-5:00 o Babies seen by [facility] providers o Does NOT accept Medicaid . [facility] Lorrine Kin, MD; Ninetta Lights, MD; [place], [place]; [place], MD o [address], [place], [place] [address] o [phone] o Mon-Fri 8:00-5:00 o After hours clinic [facility] [place], [place] [address]) [phone] Mon-Fri 5:00-8:00, Sat 12:00-6:00, Sun 10:00-4:00 o Babies seen by [facility] providers o Accepting Medicaid . [facility] o [address] [place] [address], [place], [place]  [address] o [phone]   Fax - [phone]  [place] [phone]) . Nature conservation officer at [facility], MD o [address] [place], [place], [place] [address] o [phone] o Mon-Fri 8:00-5:00 o Babies seen by [facility] providers o Does NOT accept Medicaid . [facility] [place] [facility] [place]) Tomi Likens, MD o [address] [place] [address], [place], [place] [address] o [phone] o Mon-Thur 8:00-7:00, Fri 8:00-5:00, Sat 8:00-12:00 o Babies seen by providers at [facility] o Accepting Medicaid - but does not have vaccinations in office (must be received elsewhere) o Limited availability, please call early in hospitalization  [place] ([address]) . [facility]  o Wyvonne Lenz, MD o [address], [place] [address] o [phone]  Fax [phone]   Places  to have your son circumcised (for self-pay patients//Medicaid now covers circumcisions):                                                                      [facility]     [phone]   $480 while you are in hospital         [facility]              [phone]   $269 by 4 wks                      [facility]                     [phone]   $269 by 7 days [facility]                    [phone]   $269 by 4 wks [facility]             [phone]   $225 by 2 wks    These prices sometimes change but are roughly what you can expect to pay. Please call and confirm pricing.   There are many reasons parents decide to have their sons circumsized. During the first year of life circumcised males have a reduced risk of urinary tract infections but after this year the rates between circumcised males and uncircumcised males are the same.  It can reduce rate of HIV and other STI infection later in life.  It is safe to have your son circumcised outside of the hospital and the places above perform them regularly.   Deciding about Circumcision in Baby Boys  (Up-to-date The Basics)  What is circumcision?   Circumcision is a surgery that removes the skin that covers the tip of the penis, called the "foreskin" Circumcision is usually done when a boy is between 33 and 43 days old. In the [place]  [place], circumcision is common. In some other countries, fewer boys are circumcised. Circumcision is a common tradition in some religions.  Should I have my baby boy circumcised?   There is no easy answer. Circumcision has some benefits. But it also has risks. After talking with your doctor, you will have to decide for yourself what is right for your family.  What are the benefits of circumcision?   Circumcised boys seem to have slightly lower rates of: ?Urinary tract infections ?Swelling of the opening at the tip of the penis Circumcised men seem to have slightly lower rates of: ?Urinary tract infections ?Swelling of the opening at  the tip of the penis ?Penis cancer ?HIV and other infections that you catch during sex, or transmit to a future partner(s) ?Cervical cancer in the women they have sex with Even so, in the [place], the risks of these problems are small - even in boys and men who have not been circumcised. Plus, boys and men who are not circumcised can reduce these extra risks by: ?Cleaning their penis well ?Using condoms during sex  What are the risks of circumcision?  Risks include: ?Bleeding or infection from the surgery ?Damage to or amputation of the penis ?A chance that the doctor will cut off too much or not enough of the foreskin ?A chance that sex won't feel as good later in life Only about 1 out of every 200 circumcisions leads to problems. There is also a chance that your health insurance won't pay for circumcision.  How is circumcision done in baby boys?  First, the baby gets medicine for pain relief. This might be a cream on the skin or a shot into the base of the penis. Next, the doctor cleans the baby's penis well. Then he or she uses special tools to cut off the foreskin. Finally, the doctor wraps a bandage (called gauze) around the baby's penis. If you have your baby circumcised, his doctor or nurse will give you instructions on how to care for him after the surgery. It is important that you follow those instructions carefully.

## 2019-10-08 NOTE — Progress Notes (Signed)
   PRENATAL VISIT NOTE  Subjective:  Sierra Shannon is a 24 y.o. G3P0020 at [redacted]w[redacted]d being seen today for ongoing prenatal care.  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  Patient reports fatigue.  Contractions: Not present. Vag. Bleeding: None.  Movement: Present. Denies leaking of fluid.   The following portions of the patient's history were reviewed and updated as appropriate: allergies, current medications, past family history, past medical history, past social history, past surgical history and problem list.   Objective:   Vitals:   10/08/19 1526  BP: 114/68  Pulse: 68  Weight: 127 lb (57.6 kg)    Fetal Status: Fetal Heart Rate (bpm): 151 Fundal Height: 23 cm Movement: Present     General:  Alert, oriented and cooperative. Patient is in no acute distress.  Skin: Skin is warm and dry. No rash noted.   Cardiovascular: Normal heart rate noted  Respiratory: Normal respiratory effort, no problems with respiration noted  Abdomen: Soft, gravid, appropriate for gestational age.  Pain/Pressure: Absent     Pelvic: Cervical exam deferred        Extremities: Normal range of motion.  Edema: None  Mental Status: Normal mood and affect. Normal behavior. Normal judgment and thought content.   Assessment and Plan:  Pregnancy: G3P0020 at [redacted]w[redacted]d 1. Supervision of other normal pregnancy, antepartum - Doing well - Fatigue noted, will check CBC at next visit  - Anticipatory guidance for upcoming visit discussed including fasting labs and Tdap at next visit - AFP negative and repeat NIPS low risk - Peds list given again  - Still considering MOC and circumcision, information given  Preterm labor symptoms and general obstetric precautions including but not limited to vaginal bleeding, contractions, leaking of fluid and fetal movement were reviewed in detail  with the patient. Please refer to After Visit Summary for other counseling recommendations.   Return in about 4 weeks (around 11/05/2019) for LOB, 28 week labs (fasting), In-Person.  No future appointments.  Vonzella Nipple, PA-C

## 2019-11-01 ENCOUNTER — Encounter: Payer: Self-pay | Admitting: Medical

## 2019-11-09 ENCOUNTER — Encounter: Payer: Self-pay | Admitting: Certified Nurse Midwife

## 2019-11-09 ENCOUNTER — Ambulatory Visit (INDEPENDENT_AMBULATORY_CARE_PROVIDER_SITE_OTHER): Payer: Self-pay | Admitting: Certified Nurse Midwife

## 2019-11-09 ENCOUNTER — Other Ambulatory Visit: Payer: Self-pay

## 2019-11-09 VITALS — BP 123/70 | HR 62 | Wt 129.9 lb

## 2019-11-09 DIAGNOSIS — Z3480 Encounter for supervision of other normal pregnancy, unspecified trimester: Secondary | ICD-10-CM

## 2019-11-09 DIAGNOSIS — Z3A29 29 weeks gestation of pregnancy: Secondary | ICD-10-CM

## 2019-11-09 MED ORDER — COMFORT FIT MATERNITY SUPP SM MISC
1.0000 [IU] | Freq: Every day | 0 refills | Status: DC | PRN
Start: 2019-11-09 — End: 2019-11-27

## 2019-11-09 NOTE — Patient Instructions (Signed)

## 2019-11-09 NOTE — Progress Notes (Signed)
   PRENATAL VISIT NOTE  Subjective:  Sierra Shannon is a 24 y.o. G3P0020 at [redacted]w[redacted]d being seen today for ongoing prenatal care.  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  Patient reports no complaints other than some low pelvic pain upon bending and too much standing at work.  Contractions: Not present. Vag. Bleeding: None.  Movement: Present. Denies leaking of fluid.   The following portions of the patient's history were reviewed and updated as appropriate: allergies, current medications, past family history, past medical history, past social history, past surgical history and problem list.   Objective:   Vitals:   11/09/19 0836  BP: 123/70  Pulse: 62  Weight: 129 lb 14.4 oz (58.9 kg)    Fetal Status: Fetal Heart Rate (bpm): 130 Fundal Height: 28 cm Movement: Present     General:  Alert, oriented and cooperative. Patient is in no acute distress.  Skin: Skin is warm and dry. No rash noted.   Cardiovascular: Normal heart rate noted  Respiratory: Normal respiratory effort, no problems with respiration noted  Abdomen: Soft, gravid, appropriate for gestational age.  Pain/Pressure: Absent     Pelvic: Cervical exam deferred        Extremities: Normal range of motion.  Edema: None  Mental Status: Normal mood and affect. Normal behavior. Normal judgment and thought content.   Assessment and Plan:  Pregnancy: G3P0020 at [redacted]w[redacted]d 1. Supervision of other normal pregnancy, antepartum - Pregnancy belt script printed - Reviewed birth control methods, pt desires to wait until 6wk PP visit to begin anything. Discussed importance of waiting to resume IC if she does not desire a closely spaced pregnancy and is not exclusively breastfeeding. Pt verbalized understanding.  2. [redacted] weeks gestation of pregnancy - GTT performed and 3rd trimester labs  ordered  Preterm labor symptoms and general obstetric precautions including but not limited to vaginal bleeding, contractions, leaking of fluid and fetal movement were reviewed in detail with the patient. Please refer to After Visit Summary for other counseling recommendations.   Return in about 2 weeks (around 11/23/2019) for LOB, IN-PERSON.  Future Appointments  Date Time Provider Department Center  11/09/2019  9:30 AM [facility]   Edd Arbour, CNM, MSN, [facility] 11/09/19 9:08 AM

## 2019-11-10 LAB — RPR: RPR Ser Ql: NONREACTIVE

## 2019-11-10 LAB — CBC
Hematocrit: 33.5 % — ABNORMAL LOW (ref 34.0–46.6)
Hemoglobin: 11.2 g/dL (ref 11.1–15.9)
MCH: 26.5 pg — ABNORMAL LOW (ref 26.6–33.0)
MCHC: 33.4 g/dL (ref 31.5–35.7)
MCV: 79 fL (ref 79–97)
Platelets: 475 10*3/uL — ABNORMAL HIGH (ref 150–450)
RBC: 4.23 x10E6/uL (ref 3.77–5.28)
RDW: 14 % (ref 11.7–15.4)
WBC: 11.2 10*3/uL — ABNORMAL HIGH (ref 3.4–10.8)

## 2019-11-10 LAB — GLUCOSE TOLERANCE, 2 HOURS W/ 1HR
Glucose, 1 hour: 94 mg/dL (ref 65–179)
Glucose, 2 hour: 105 mg/dL (ref 65–152)
Glucose, Fasting: 77 mg/dL (ref 65–91)

## 2019-11-10 LAB — ANTIBODY SCREEN: Antibody Screen: NEGATIVE

## 2019-11-10 LAB — HIV ANTIBODY (ROUTINE TESTING W REFLEX): HIV Screen 4th Generation wRfx: NONREACTIVE

## 2019-11-12 ENCOUNTER — Encounter (HOSPITAL_COMMUNITY): Payer: Self-pay | Admitting: Obstetrics & Gynecology

## 2019-11-12 ENCOUNTER — Inpatient Hospital Stay (HOSPITAL_COMMUNITY)
Admission: AD | Admit: 2019-11-12 | Discharge: 2019-11-13 | Disposition: A | Discharge status: Home / Self Care | Payer: Medicaid Other | Attending: Obstetrics & Gynecology | Admitting: Obstetrics & Gynecology

## 2019-11-12 ENCOUNTER — Other Ambulatory Visit: Payer: Self-pay

## 2019-11-12 DIAGNOSIS — W19XXXA Unspecified fall, initial encounter: Secondary | ICD-10-CM

## 2019-11-12 DIAGNOSIS — Z3A29 29 weeks gestation of pregnancy: Secondary | ICD-10-CM | POA: Insufficient documentation

## 2019-11-12 DIAGNOSIS — Y939 Activity, unspecified: Secondary | ICD-10-CM | POA: Diagnosis not present

## 2019-11-12 DIAGNOSIS — Z3480 Encounter for supervision of other normal pregnancy, unspecified trimester: Secondary | ICD-10-CM

## 2019-11-12 DIAGNOSIS — Q999 Chromosomal abnormality, unspecified: Secondary | ICD-10-CM

## 2019-11-12 DIAGNOSIS — Z6791 Unspecified blood type, Rh negative: Secondary | ICD-10-CM | POA: Insufficient documentation

## 2019-11-12 DIAGNOSIS — W108XXA Fall (on) (from) other stairs and steps, initial encounter: Secondary | ICD-10-CM | POA: Diagnosis not present

## 2019-11-12 DIAGNOSIS — M549 Dorsalgia, unspecified: Secondary | ICD-10-CM | POA: Diagnosis not present

## 2019-11-12 DIAGNOSIS — O26899 Other specified pregnancy related conditions, unspecified trimester: Secondary | ICD-10-CM

## 2019-11-12 DIAGNOSIS — Y929 Unspecified place or not applicable: Secondary | ICD-10-CM | POA: Insufficient documentation

## 2019-11-12 DIAGNOSIS — O26893 Other specified pregnancy related conditions, third trimester: Secondary | ICD-10-CM

## 2019-11-12 LAB — URINALYSIS, ROUTINE W REFLEX MICROSCOPIC
Bilirubin Urine: NEGATIVE
Glucose, UA: NEGATIVE mg/dL
Hgb urine dipstick: NEGATIVE
Ketones, ur: NEGATIVE mg/dL
Leukocytes,Ua: NEGATIVE
Nitrite: NEGATIVE
Protein, ur: NEGATIVE mg/dL
Specific Gravity, Urine: 1.019 (ref 1.005–1.030)
pH: 5 (ref 5.0–8.0)

## 2019-11-12 MED ORDER — RHO D IMMUNE GLOBULIN 1500 UNIT/2ML IJ SOSY
300.0000 ug | PREFILLED_SYRINGE | Freq: Once | INTRAMUSCULAR | Status: AC
  Administered 2019-11-13: 300 ug via INTRAMUSCULAR
  Filled 2019-11-12: qty 2

## 2019-11-12 NOTE — MAU Note (Signed)
...  Sierra Shannon is a 24 y.o. at [redacted]w[redacted]d here in MAU reporting: she was taking her dog out and she tripped over a step and fell onto her right side of her stomach. Pt reports she is experiencing lower left back pain. Pt reports the pain is intermittent and rates it as a 4/10. +FM. No VB.   Onset of complaint: today around noon FHT: 145 external

## 2019-11-12 NOTE — MAU Provider Note (Addendum)
History     CSN: [id number]  Arrival date and time: 11/12/19 1594   First Provider Initiated Contact with Patient 11/12/19 1937      Chief Complaint  Patient presents with  . Back Pain   HPI  Ms. Sierra Shannon is a 24 y.o. G3P0020 at [redacted]w[redacted]d who presents to MAU today with complaint of a fall at home around noon today. The patient states that she got tripped up while taking her dog out. She fell on her side. She states mild back pain. She denies abdominal pain, vaginal bleeding, LOF or contractions. She reports normal fetal movement.    OB History    Gravida  3   Para      Term      Preterm      AB  2   Living  0     SAB  2   TAB      Ectopic      Multiple      Live Births              Past Medical History:  Diagnosis Date  . Medical history non-contributory     Past Surgical History:  Procedure Laterality Date  . NO PAST SURGERIES      History reviewed. No pertinent family history.  Social History   Tobacco Use  . Smoking status: Never Smoker  . Smokeless tobacco: Never Used  Vaping Use  . Vaping Use: Former  Substance Use Topics  . Alcohol use: No  . Drug use: Never    Allergies: No Known Allergies  Medications Prior to Admission  Medication Sig Dispense Refill Last Dose  . Prenatal Vit-Fe Fumarate-FA (PRENATAL VITAMINS PO) Take 1 tablet by mouth.   11/12/2019 at Unknown time  . acetaminophen (TYLENOL) 500 MG tablet Take 2 tablets (1,000 mg total) by mouth every 6 (six) hours as needed for headache. 60 tablet 0   . Elastic Bandages & Supports (COMFORT FIT MATERNITY SUPP SM) MISC 1 Units by Does not apply route daily as needed. 1 each 0     Review of Systems  Constitutional: Negative for fever.  Gastrointestinal: Negative for abdominal pain, constipation, diarrhea, nausea and vomiting.  Genitourinary: Negative for dysuria, frequency, urgency, vaginal bleeding and vaginal discharge.  Musculoskeletal: Positive for back pain.    Physical Exam   Blood pressure 119/63, pulse 80, temperature 98.6 F (37 C), temperature source Oral, resp. rate 15, last menstrual period 04/02/2019, SpO2 99 %, unknown if currently breastfeeding.  Physical Exam Vitals and nursing note reviewed.  Constitutional:      General: She is not in acute distress.    Appearance: She is well-developed and normal weight.  HENT:     Head: Normocephalic and atraumatic.  Cardiovascular:     Rate and Rhythm: Normal rate.  Pulmonary:     Effort: Pulmonary effort is normal.  Abdominal:     General: There is no distension.     Palpations: Abdomen is soft. There is no mass.     Tenderness: There is no abdominal tenderness. There is no guarding or rebound.  Skin:    General: Skin is warm and dry.     Findings: No erythema.  Neurological:     Mental Status: She is alert and oriented to person, place, and time.     Results for orders placed or performed during the hospital encounter of 11/12/19 (from the past 24 hour(s))  Urinalysis, Routine w reflex microscopic Urine, Clean Catch  Status: Abnormal   Collection Time: 11/12/19  7:50 PM  Result Value Ref Range   Color, Urine YELLOW YELLOW   APPearance HAZY (A) CLEAR   Specific Gravity, Urine 1.019 1.005 - 1.030   pH 5.0 5.0 - 8.0   Glucose, UA NEGATIVE NEGATIVE mg/dL   Hgb urine dipstick NEGATIVE NEGATIVE   Bilirubin Urine NEGATIVE NEGATIVE   Ketones, ur NEGATIVE NEGATIVE mg/dL   Protein, ur NEGATIVE NEGATIVE mg/dL   Nitrite NEGATIVE NEGATIVE   Leukocytes,Ua NEGATIVE NEGATIVE    Fetal Monitoring: Baseline: 140 bpm Variability: moderate  Accelerations: 10 x 10 Decelerations: none Contractions: one noted with mild UI  MAU Course  Procedures None  MDM 4 hours of monitoring for fall with right sided abdominal trauma  2035 - care turned over to Wynelle Bourgeois, CNM   Vonzella Nipple, PA-C 11/12/2019, 8:36 PM  Assessment and Plan  Blood Type noted to be O Neg Has not had her  routine Rhophylac in office Hospital policy dictates need to draw a Kleihauer-Betke prior to administration Will give Rhophylac tonight Results for orders placed or performed during the hospital encounter of 11/12/19 (from the past 24 hour(s))  Urinalysis, Routine w reflex microscopic Urine, Clean Catch     Status: Abnormal   Collection Time: 11/12/19  7:50 PM  Result Value Ref Range   Color, Urine YELLOW YELLOW   APPearance HAZY (A) CLEAR   Specific Gravity, Urine 1.019 1.005 - 1.030   pH 5.0 5.0 - 8.0   Glucose, UA NEGATIVE NEGATIVE mg/dL   Hgb urine dipstick NEGATIVE NEGATIVE   Bilirubin Urine NEGATIVE NEGATIVE   Ketones, ur NEGATIVE NEGATIVE mg/dL   Protein, ur NEGATIVE NEGATIVE mg/dL   Nitrite NEGATIVE NEGATIVE   Leukocytes,Ua NEGATIVE NEGATIVE  Rh IG workup (includes ABO/Rh)     Status: None (Preliminary result)   Collection Time: 11/12/19  9:55 PM  Result Value Ref Range   Gestational Age(Wks) 29    ABO/RH(D) O NEG    Antibody Screen NEG    Unit Number [id number]    Blood Component Type RHIG    Unit division 00    Status of Unit ISSUED    Transfusion Status      OK TO TRANSFUSE Performed at [facility], [address], [place], [place] [address]   Kleihauer-Betke stain     Status: None   Collection Time: 11/12/19 10:18 PM  Result Value Ref Range   Fetal Cells % 0 %   Quantitation Fetal Hemoglobin 0 mL   # Vials RhIg 1     DIscharged home Informed K-B is negative Will need another Rhophylac postpartum Encouraged to return here or to other Urgent Care/ED if she develops worsening of symptoms, increase in pain, fever, or other concerning symptoms.    Aviva Signs, CNM

## 2019-11-13 DIAGNOSIS — O26893 Other specified pregnancy related conditions, third trimester: Secondary | ICD-10-CM

## 2019-11-13 DIAGNOSIS — Z3480 Encounter for supervision of other normal pregnancy, unspecified trimester: Secondary | ICD-10-CM

## 2019-11-13 DIAGNOSIS — Z3A29 29 weeks gestation of pregnancy: Secondary | ICD-10-CM

## 2019-11-13 DIAGNOSIS — Q999 Chromosomal abnormality, unspecified: Secondary | ICD-10-CM

## 2019-11-13 DIAGNOSIS — W19XXXA Unspecified fall, initial encounter: Secondary | ICD-10-CM

## 2019-11-13 LAB — KLEIHAUER-BETKE STAIN
# Vials RhIg: 1
Fetal Cells %: 0 %
Quantitation Fetal Hemoglobin: 0 mL

## 2019-11-13 NOTE — Discharge Instructions (Signed)
Rh0 [D] Immune Globulin injection What is this medicine? RhO [D] IMMUNE GLOBULIN (i MYOON GLOB yoo lin) is used to treat idiopathic thrombocytopenic purpura (ITP). This medicine is used in RhO negative mothers who are pregnant with a RhO positive child. It is also used after a transfusion of RhO positive blood into a RhO negative person. This medicine may be used for other purposes; ask your health care provider or pharmacist if you have questions. COMMON BRAND NAME(S): BayRho-D, HyperRHO S/D, MICRhoGAM, RhoGAM, Rhophylac, WinRho SDF What should I tell my health care provider before I take this medicine? They need to know if you have any of these conditions:  bleeding disorders  low levels of immunoglobulin A in the body  no spleen  an unusual or allergic reaction to human immune globulin, other medicines, foods, dyes, or preservatives  pregnant or trying to get pregnant  breast-feeding How should I use this medicine? This medicine is for injection into a muscle or into a vein. It is given by a health care professional in a hospital or clinic setting. Talk to your pediatrician regarding the use of this medicine in children. This medicine is not approved for use in children. Overdosage: If you think you have taken too much of this medicine contact a poison control center or emergency room at once. NOTE: This medicine is only for you. Do not share this medicine with others. What if I miss a dose? It is important not to miss your dose. Call your doctor or health care professional if you are unable to keep an appointment. What may interact with this medicine?  live virus vaccines, like measles, mumps, or rubella This list may not describe all possible interactions. Give your health care provider a list of all the medicines, herbs, non-prescription drugs, or dietary supplements you use. Also tell them if you smoke, drink alcohol, or use illegal drugs. Some items may interact with your  medicine. What should I watch for while using this medicine? This medicine is made from human blood. It may be possible to pass an infection in this medicine. Talk to your doctor about the risks and benefits of this medicine. This medicine may interfere with live virus vaccines. Before you get live virus vaccines tell your health care professional if you have received this medicine within the past 3 months. What side effects may I notice from receiving this medicine? Side effects that you should report to your doctor or health care professional as soon as possible:  allergic reactions like skin rash, itching or hives, swelling of the face, lips, or tongue  breathing problems  chest pain or tightness  yellowing of the eyes or skin Side effects that usually do not require medical attention (report to your doctor or health care professional if they continue or are bothersome):  fever  pain and tenderness at site where injected This list may not describe all possible side effects. Call your doctor for medical advice about side effects. You may report side effects to FDA at 1-800-FDA-1088. Where should I keep my medicine? This drug is given in a hospital or clinic and will not be stored at home. NOTE: This sheet is a summary. It may not cover all possible information. If you have questions about this medicine, talk to your doctor, pharmacist, or health care provider.  2020 Elsevier/Gold Standard (2007-11-03 14:06:10) Third Trimester of Pregnancy The third trimester is from week 28 through week 40 (months 7 through 9). The third trimester is a time  when the unborn baby (fetus) is growing rapidly. At the end of the ninth month, the fetus is about 20 inches in length and weighs 6-10 pounds. Body changes during your third trimester Your body will continue to go through many changes during pregnancy. The changes vary from woman to woman. During the third trimester:  Your weight will continue to  increase. You can expect to gain 25-35 pounds (11-16 kg) by the end of the pregnancy.  You may begin to get stretch marks on your hips, abdomen, and breasts.  You may urinate more often because the fetus is moving lower into your pelvis and pressing on your bladder.  You may develop or continue to have heartburn. This is caused by increased hormones that slow down muscles in the digestive tract.  You may develop or continue to have constipation because increased hormones slow digestion and cause the muscles that push waste through your intestines to relax.  You may develop hemorrhoids. These are swollen veins (varicose veins) in the rectum that can itch or be painful.  You may develop swollen, bulging veins (varicose veins) in your legs.  You may have increased body aches in the pelvis, back, or thighs. This is due to weight gain and increased hormones that are relaxing your joints.  You may have changes in your hair. These can include thickening of your hair, rapid growth, and changes in texture. Some women also have hair loss during or after pregnancy, or hair that feels dry or thin. Your hair will most likely return to normal after your baby is born.  Your breasts will continue to grow and they will continue to become tender. A yellow fluid (colostrum) may leak from your breasts. This is the first milk you are producing for your baby.  Your belly button may stick out.  You may notice more swelling in your hands, face, or ankles.  You may have increased tingling or numbness in your hands, arms, and legs. The skin on your belly may also feel numb.  You may feel short of breath because of your expanding uterus.  You may have more problems sleeping. This can be caused by the size of your belly, increased need to urinate, and an increase in your body's metabolism.  You may notice the fetus "dropping," or moving lower in your abdomen (lightening).  You may have increased vaginal  discharge.  You may notice your joints feel loose and you may have pain around your pelvic bone. What to expect at prenatal visits You will have prenatal exams every 2 weeks until week 36. Then you will have weekly prenatal exams. During a routine prenatal visit:  You will be weighed to make sure you and the baby are growing normally.  Your blood pressure will be taken.  Your abdomen will be measured to track your baby's growth.  The fetal heartbeat will be listened to.  Any test results from the previous visit will be discussed.  You may have a cervical check near your due date to see if your cervix has softened or thinned (effaced).  You will be tested for Group B streptococcus. This happens between 35 and 37 weeks. Your health care provider may ask you:  What your birth plan is.  How you are feeling.  If you are feeling the baby move.  If you have had any abnormal symptoms, such as leaking fluid, bleeding, severe headaches, or abdominal cramping.  If you are using any tobacco products, including cigarettes, chewing tobacco,  and electronic cigarettes.  If you have any questions. Other tests or screenings that may be performed during your third trimester include:  Blood tests that check for low iron levels (anemia).  Fetal testing to check the health, activity level, and growth of the fetus. Testing is done if you have certain medical conditions or if there are problems during the pregnancy.  Nonstress test (NST). This test checks the health of your baby to make sure there are no signs of problems, such as the baby not getting enough oxygen. During this test, a belt is placed around your belly. The baby is made to move, and its heart rate is monitored during movement. What is false labor? False labor is a condition in which you feel small, irregular tightenings of the muscles in the womb (contractions) that usually go away with rest, changing position, or drinking water.  These are called Braxton Hicks contractions. Contractions may last for hours, days, or even weeks before true labor sets in. If contractions come at regular intervals, become more frequent, increase in intensity, or become painful, you should see your health care provider. What are the signs of labor?  Abdominal cramps.  Regular contractions that start at 10 minutes apart and become stronger and more frequent with time.  Contractions that start on the top of the uterus and spread down to the lower abdomen and back.  Increased pelvic pressure and dull back pain.  A watery or bloody mucus discharge that comes from the vagina.  Leaking of amniotic fluid. This is also known as your "water breaking." It could be a slow trickle or a gush. Let your health care provider know if it has a color or strange odor. If you have any of these signs, call your health care provider right away, even if it is before your due date. Follow these instructions at home: Medicines  Follow your health care provider's instructions regarding medicine use. Specific medicines may be either safe or unsafe to take during pregnancy.  Take a prenatal vitamin that contains at least 600 micrograms (mcg) of folic acid.  If you develop constipation, try taking a stool softener if your health care provider approves. Eating and drinking   Eat a balanced diet that includes fresh fruits and vegetables, whole grains, good sources of protein such as meat, eggs, or tofu, and low-fat dairy. Your health care provider will help you determine the amount of weight gain that is right for you.  Avoid raw meat and uncooked cheese. These carry germs that can cause birth defects in the baby.  If you have low calcium intake from food, talk to your health care provider about whether you should take a daily calcium supplement.  Eat four or five small meals rather than three large meals a day.  Limit foods that are high in fat and processed  sugars, such as fried and sweet foods.  To prevent constipation: ? Drink enough fluid to keep your urine clear or pale yellow. ? Eat foods that are high in fiber, such as fresh fruits and vegetables, whole grains, and beans. Activity  Exercise only as directed by your health care provider. Most women can continue their usual exercise routine during pregnancy. Try to exercise for 30 minutes at least 5 days a week. Stop exercising if you experience uterine contractions.  Avoid heavy lifting.  Do not exercise in extreme heat or humidity, or at high altitudes.  Wear low-heel, comfortable shoes.  Practice good posture.  You may  continue to have sex unless your health care provider tells you otherwise. Relieving pain and discomfort  Take frequent breaks and rest with your legs elevated if you have leg cramps or low back pain.  Take warm sitz baths to soothe any pain or discomfort caused by hemorrhoids. Use hemorrhoid cream if your health care provider approves.  Wear a good support bra to prevent discomfort from breast tenderness.  If you develop varicose veins: ? Wear support pantyhose or compression stockings as told by your healthcare provider. ? Elevate your feet for 15 minutes, 3-4 times a day. Prenatal care  Write down your questions. Take them to your prenatal visits.  Keep all your prenatal visits as told by your health care provider. This is important. Safety  Wear your seat belt at all times when driving.  Make a list of emergency phone numbers, including numbers for family, friends, the hospital, and police and fire departments. General instructions  Avoid cat litter boxes and soil used by cats. These carry germs that can cause birth defects in the baby. If you have a cat, ask someone to clean the litter box for you.  Do not travel far distances unless it is absolutely necessary and only with the approval of your health care provider.  Do not use hot tubs, steam  rooms, or saunas.  Do not drink alcohol.  Do not use any products that contain nicotine or tobacco, such as cigarettes and e-cigarettes. If you need help quitting, ask your health care provider.  Do not use any medicinal herbs or unprescribed drugs. These chemicals affect the formation and growth of the baby.  Do not douche or use tampons or scented sanitary pads.  Do not cross your legs for long periods of time.  To prepare for the arrival of your baby: ? Take prenatal classes to understand, practice, and ask questions about labor and delivery. ? Make a trial run to the hospital. ? Visit the hospital and tour the maternity area. ? Arrange for maternity or paternity leave through employers. ? Arrange for family and friends to take care of pets while you are in the hospital. ? Purchase a rear-facing car seat and make sure you know how to install it in your car. ? Pack your hospital bag. ? Prepare the babys nursery. Make sure to remove all pillows and stuffed animals from the baby's crib to prevent suffocation.  Visit your dentist if you have not gone during your pregnancy. Use a soft toothbrush to brush your teeth and be gentle when you floss. Contact a health care provider if:  You are unsure if you are in labor or if your water has broken.  You become dizzy.  You have mild pelvic cramps, pelvic pressure, or nagging pain in your abdominal area.  You have lower back pain.  You have persistent nausea, vomiting, or diarrhea.  You have an unusual or bad smelling vaginal discharge.  You have pain when you urinate. Get help right away if:  Your water breaks before 37 weeks.  You have regular contractions less than 5 minutes apart before 37 weeks.  You have a fever.  You are leaking fluid from your vagina.  You have spotting or bleeding from your vagina.  You have severe abdominal pain or cramping.  You have rapid weight loss or weight gain.  You have shortness of  breath with chest pain.  You notice sudden or extreme swelling of your face, hands, ankles, feet, or legs.  Your  baby makes fewer than 10 movements in 2 hours.  You have severe headaches that do not go away when you take medicine.  You have vision changes. Summary  The third trimester is from week 28 through week 40, months 7 through 9. The third trimester is a time when the unborn baby (fetus) is growing rapidly.  During the third trimester, your discomfort may increase as you and your baby continue to gain weight. You may have abdominal, leg, and back pain, sleeping problems, and an increased need to urinate.  During the third trimester your breasts will keep growing and they will continue to become tender. A yellow fluid (colostrum) may leak from your breasts. This is the first milk you are producing for your baby.  False labor is a condition in which you feel small, irregular tightenings of the muscles in the womb (contractions) that eventually go away. These are called Braxton Hicks contractions. Contractions may last for hours, days, or even weeks before true labor sets in.  Signs of labor can include: abdominal cramps; regular contractions that start at 10 minutes apart and become stronger and more frequent with time; watery or bloody mucus discharge that comes from the vagina; increased pelvic pressure and dull back pain; and leaking of amniotic fluid. This information is not intended to replace advice given to you by your health care provider. Make sure you discuss any questions you have with your health care provider. Document Revised: 06/26/2018 Document Reviewed: 04/10/2016 Elsevier Patient Education  2020 [employer].

## 2019-11-14 LAB — RH IG WORKUP (INCLUDES ABO/RH)
ABO/RH(D): O NEG
Antibody Screen: NEGATIVE
Gestational Age(Wks): 29
Unit division: 0

## 2019-11-27 ENCOUNTER — Encounter: Payer: Self-pay | Admitting: Medical

## 2019-11-27 ENCOUNTER — Ambulatory Visit (INDEPENDENT_AMBULATORY_CARE_PROVIDER_SITE_OTHER): Payer: Self-pay | Admitting: Medical

## 2019-11-27 ENCOUNTER — Other Ambulatory Visit: Payer: Self-pay

## 2019-11-27 VITALS — BP 117/74 | HR 99 | Wt 133.9 lb

## 2019-11-27 DIAGNOSIS — Z23 Encounter for immunization: Secondary | ICD-10-CM

## 2019-11-27 DIAGNOSIS — Z3480 Encounter for supervision of other normal pregnancy, unspecified trimester: Secondary | ICD-10-CM

## 2019-11-27 DIAGNOSIS — Z6791 Unspecified blood type, Rh negative: Secondary | ICD-10-CM

## 2019-11-27 DIAGNOSIS — O26893 Other specified pregnancy related conditions, third trimester: Secondary | ICD-10-CM

## 2019-11-27 MED ORDER — COMFORT FIT MATERNITY SUPP MED MISC: 1.0000 [IU] | Freq: Every day | 0 refills | Status: DC

## 2019-11-27 NOTE — Progress Notes (Signed)
Pain and swelling in calf muscle in legs.

## 2019-11-27 NOTE — Progress Notes (Signed)
   PRENATAL VISIT NOTE  Subjective:  Sierra Shannon is a 24 y.o. G3P0020 at [redacted]w[redacted]d being seen today for ongoing prenatal care.  She is currently monitored for the following issues for this low-risk pregnancy and has Supervision of other normal pregnancy, antepartum; Genetic disorder; and Rh negative, maternal on their problem list.  Patient reports no complaints.  Contractions: Not present. Vag. Bleeding: None.  Movement: Present. Denies leaking of fluid.   The following portions of the patient's history were reviewed and updated as appropriate: allergies, current medications, past family history, past medical history, past social history, past surgical history and problem list.   Objective:   Vitals:   11/27/19 1015  BP: 117/74  Pulse: 99  Weight: 133 lb 14.4 oz (60.7 kg)    Fetal Status: Fetal Heart Rate (bpm): 154 Fundal Height: 30 cm Movement: Present     General:  Alert, oriented and cooperative. Patient is in no acute distress.  Skin: Skin is warm and dry. No rash noted.   Cardiovascular: Normal heart rate noted  Respiratory: Normal respiratory effort, no problems with respiration noted  Abdomen: Soft, gravid, appropriate for gestational age.  Pain/Pressure: Absent     Pelvic: Cervical exam deferred        Extremities: Normal range of motion.  Edema: None  Mental Status: Normal mood and affect. Normal behavior. Normal judgment and thought content.   Assessment and Plan:  Pregnancy: G3P0020 at [redacted]w[redacted]d 1. Supervision of other normal pregnancy, antepartum - Tdap vaccine greater than or equal to 7yo IM - Reviewed normal 28 week labs from previous visit   2. Rh negative status during pregnancy in third trimester - Rhogam given in [facility]   Preterm labor symptoms and general obstetric precautions including but not limited to vaginal bleeding, contractions, leaking of fluid and fetal movement were reviewed in detail with the patient. Please refer to After Visit Summary for other  counseling recommendations.   Return in about 2 weeks (around 12/11/2019) for LOB, In-Person.  No future appointments.  Vonzella Nipple, PA-C

## 2019-11-27 NOTE — Patient Instructions (Addendum)
Fetal Movement Counts Patient Name: ________________________________________________ Patient Due Date: ____________________ What is a fetal movement count?  A fetal movement count is the number of times that you feel your baby move during a certain amount of time. This may also be called a fetal kick count. A fetal movement count is recommended for every pregnant woman. You may be asked to start counting fetal movements as early as week 28 of your pregnancy. Pay attention to when your baby is most active. You may notice your baby's sleep and wake cycles. You may also notice things that make your baby move more. You should do a fetal movement count:  When your baby is normally most active.  At the same time each day. A good time to count movements is while you are resting, after having something to eat and drink. How do I count fetal movements? 1. Find a quiet, comfortable area. Sit, or lie down on your side. 2. Write down the date, the start time and stop time, and the number of movements that you felt between those two times. Take this information with you to your health care visits. 3. Write down your start time when you feel the first movement. 4. Count kicks, flutters, swishes, rolls, and jabs. You should feel at least 10 movements. 5. You may stop counting after you have felt 10 movements, or if you have been counting for 2 hours. Write down the stop time. 6. If you do not feel 10 movements in 2 hours, contact your health care provider for further instructions. Your health care provider may want to do additional tests to assess your baby's well-being. Contact a health care provider if:  You feel fewer than 10 movements in 2 hours.  Your baby is not moving like he or she usually does. Date: ____________ Start time: ____________ Stop time: ____________ Movements: ____________ Date: ____________ Start time: ____________ Stop time: ____________ Movements: ____________ Date: ____________  Start time: ____________ Stop time: ____________ Movements: ____________ Date: ____________ Start time: ____________ Stop time: ____________ Movements: ____________ Date: ____________ Start time: ____________ Stop time: ____________ Movements: ____________ Date: ____________ Start time: ____________ Stop time: ____________ Movements: ____________ Date: ____________ Start time: ____________ Stop time: ____________ Movements: ____________ Date: ____________ Start time: ____________ Stop time: ____________ Movements: ____________ Date: ____________ Start time: ____________ Stop time: ____________ Movements: ____________ This information is not intended to replace advice given to you by your health care provider. Make sure you discuss any questions you have with your health care provider. Document Revised: 10/23/2018 Document Reviewed: 10/23/2018 Elsevier Patient Education  2020 [employer] Braxton Hicks Contractions Contractions of the uterus can occur throughout pregnancy, but they are not always a sign that you are in labor. You may have practice contractions called Braxton Hicks contractions. These false labor contractions are sometimes confused with true labor. What are Braxton Hicks contractions? Braxton Hicks contractions are tightening movements that occur in the muscles of the uterus before labor. Unlike true labor contractions, these contractions do not result in opening (dilation) and thinning of the cervix. Toward the end of pregnancy (32-34 weeks), Braxton Hicks contractions can happen more often and may become stronger. These contractions are sometimes difficult to tell apart from true labor because they can be very uncomfortable. You should not feel embarrassed if you go to the hospital with false labor. Sometimes, the only way to tell if you are in true labor is for your health care provider to look for changes in the cervix. The health care provider   will do a physical exam and may  monitor your contractions. If you are not in true labor, the exam should show that your cervix is not dilating and your water has not broken. If there are no other health problems associated with your pregnancy, it is completely safe for you to be sent home with false labor. You may continue to have Braxton Hicks contractions until you go into true labor. How to tell the difference between true labor and false labor True labor  Contractions last 30-70 seconds.  Contractions become very regular.  Discomfort is usually felt in the top of the uterus, and it spreads to the lower abdomen and low back.  Contractions do not go away with walking.  Contractions usually become more intense and increase in frequency.  The cervix dilates and gets thinner. False labor  Contractions are usually shorter and not as strong as true labor contractions.  Contractions are usually irregular.  Contractions are often felt in the front of the lower abdomen and in the groin.  Contractions may go away when you walk around or change positions while lying down.  Contractions get weaker and are shorter-lasting as time goes on.  The cervix usually does not dilate or become thin. Follow these instructions at home:   Take over-the-counter and prescription medicines only as told by your health care provider.  Keep up with your usual exercises and follow other instructions from your health care provider.  Eat and drink lightly if you think you are going into labor.  If Braxton Hicks contractions are making you uncomfortable: ? Change your position from lying down or resting to walking, or change from walking to resting. ? Sit and rest in a tub of warm water. ? Drink enough fluid to keep your urine pale yellow. Dehydration may cause these contractions. ? Do slow and deep breathing several times an hour.  Keep all follow-up prenatal visits as told by your health care provider. This is important. Contact a  health care provider if:  You have a fever.  You have continuous pain in your abdomen. Get help right away if:  Your contractions become stronger, more regular, and closer together.  You have fluid leaking or gushing from your vagina.  You pass blood-tinged mucus (bloody show).  You have bleeding from your vagina.  You have low back pain that you never had before.  You feel your baby's head pushing down and causing pelvic pressure.  Your baby is not moving inside you as much as it used to. Summary  Contractions that occur before labor are called Braxton Hicks contractions, false labor, or practice contractions.  Braxton Hicks contractions are usually shorter, weaker, farther apart, and less regular than true labor contractions. True labor contractions usually become progressively stronger and regular, and they become more frequent.  Manage discomfort from [facility] contractions by changing position, resting in a warm bath, drinking plenty of water, or practicing deep breathing. This information is not intended to replace advice given to you by your health care provider. Make sure you discuss any questions you have with your health care provider. Document Revised: 02/15/2017 Document Reviewed: 07/19/2016 Elsevier Patient Education  2020 [employer].   For Pregnancy Belt:  [facility] [employer] [facility]   [address] [place], [place], [place] [address] Phone: [phone]  Monday     8:30AM-5PM Tuesday 8:30AM-5PM Wednesday 8:30AM-5PM Thursday 8:30AM-5PM Friday  8:30AM-5PM Saturday Closed Sunday Closed

## 2019-12-01 ENCOUNTER — Encounter: Payer: Self-pay | Admitting: *Deleted

## 2019-12-11 ENCOUNTER — Other Ambulatory Visit: Payer: Self-pay

## 2019-12-11 ENCOUNTER — Ambulatory Visit (INDEPENDENT_AMBULATORY_CARE_PROVIDER_SITE_OTHER): Payer: Self-pay | Admitting: Medical

## 2019-12-11 VITALS — BP 114/67 | HR 73 | Wt 137.1 lb

## 2019-12-11 DIAGNOSIS — Z3480 Encounter for supervision of other normal pregnancy, unspecified trimester: Secondary | ICD-10-CM

## 2019-12-11 DIAGNOSIS — Z3A33 33 weeks gestation of pregnancy: Secondary | ICD-10-CM

## 2019-12-11 DIAGNOSIS — Z6791 Unspecified blood type, Rh negative: Secondary | ICD-10-CM

## 2019-12-11 DIAGNOSIS — O26893 Other specified pregnancy related conditions, third trimester: Secondary | ICD-10-CM

## 2019-12-11 NOTE — Patient Instructions (Signed)
Braxton Hicks Contractions Contractions of the uterus can occur throughout pregnancy, but they are not always a sign that you are in labor. You may have practice contractions called Braxton Hicks contractions. These false labor contractions are sometimes confused with true labor. What are Braxton Hicks contractions? Braxton Hicks contractions are tightening movements that occur in the muscles of the uterus before labor. Unlike true labor contractions, these contractions do not result in opening (dilation) and thinning of the cervix. Toward the end of pregnancy (32-34 weeks), Braxton Hicks contractions can happen more often and may become stronger. These contractions are sometimes difficult to tell apart from true labor because they can be very uncomfortable. You should not feel embarrassed if you go to the hospital with false labor. Sometimes, the only way to tell if you are in true labor is for your health care provider to look for changes in the cervix. The health care provider will do a physical exam and may monitor your contractions. If you are not in true labor, the exam should show that your cervix is not dilating and your water has not broken. If there are no other health problems associated with your pregnancy, it is completely safe for you to be sent home with false labor. You may continue to have Braxton Hicks contractions until you go into true labor. How to tell the difference between true labor and false labor True labor  Contractions last 30-70 seconds.  Contractions become very regular.  Discomfort is usually felt in the top of the uterus, and it spreads to the lower abdomen and low back.  Contractions do not go away with walking.  Contractions usually become more intense and increase in frequency.  The cervix dilates and gets thinner. False labor  Contractions are usually shorter and not as strong as true labor contractions.  Contractions are usually irregular.  Contractions  are often felt in the front of the lower abdomen and in the groin.  Contractions may go away when you walk around or change positions while lying down.  Contractions get weaker and are shorter-lasting as time goes on.  The cervix usually does not dilate or become thin. Follow these instructions at home:   Take over-the-counter and prescription medicines only as told by your health care provider.  Keep up with your usual exercises and follow other instructions from your health care provider.  Eat and drink lightly if you think you are going into labor.  If Braxton Hicks contractions are making you uncomfortable: ? Change your position from lying down or resting to walking, or change from walking to resting. ? Sit and rest in a tub of warm water. ? Drink enough fluid to keep your urine pale yellow. Dehydration may cause these contractions. ? Do slow and deep breathing several times an hour.  Keep all follow-up prenatal visits as told by your health care provider. This is important. Contact a health care provider if:  You have a fever.  You have continuous pain in your abdomen. Get help right away if:  Your contractions become stronger, more regular, and closer together.  You have fluid leaking or gushing from your vagina.  You pass blood-tinged mucus (bloody show).  You have bleeding from your vagina.  You have low back pain that you never had before.  You feel your baby's head pushing down and causing pelvic pressure.  Your baby is not moving inside you as much as it used to. Summary  Contractions that occur before labor are   called Braxton Hicks contractions, false labor, or practice contractions.  Braxton Hicks contractions are usually shorter, weaker, farther apart, and less regular than true labor contractions. True labor contractions usually become progressively stronger and regular, and they become more frequent.  Manage discomfort from Braxton Hicks contractions  by changing position, resting in a warm bath, drinking plenty of water, or practicing deep breathing. This information is not intended to replace advice given to you by your health care provider. Make sure you discuss any questions you have with your health care provider. Document Revised: 02/15/2017 Document Reviewed: 07/19/2016 Elsevier Patient Education  2020 [employer] Fetal Movement Counts Patient Name: ________________________________________________ Patient Due Date: ____________________ What is a fetal movement count?  A fetal movement count is the number of times that you feel your baby move during a certain amount of time. This may also be called a fetal kick count. A fetal movement count is recommended for every pregnant woman. You may be asked to start counting fetal movements as early as week 28 of your pregnancy. Pay attention to when your baby is most active. You may notice your baby's sleep and wake cycles. You may also notice things that make your baby move more. You should do a fetal movement count:  When your baby is normally most active.  At the same time each day. A good time to count movements is while you are resting, after having something to eat and drink. How do I count fetal movements? 1. Find a quiet, comfortable area. Sit, or lie down on your side. 2. Write down the date, the start time and stop time, and the number of movements that you felt between those two times. Take this information with you to your health care visits. 3. Write down your start time when you feel the first movement. 4. Count kicks, flutters, swishes, rolls, and jabs. You should feel at least 10 movements. 5. You may stop counting after you have felt 10 movements, or if you have been counting for 2 hours. Write down the stop time. 6. If you do not feel 10 movements in 2 hours, contact your health care provider for further instructions. Your health care provider may want to do additional tests  to assess your baby's well-being. Contact a health care provider if:  You feel fewer than 10 movements in 2 hours.  Your baby is not moving like he or she usually does. Date: ____________ Start time: ____________ Stop time: ____________ Movements: ____________ Date: ____________ Start time: ____________ Stop time: ____________ Movements: ____________ Date: ____________ Start time: ____________ Stop time: ____________ Movements: ____________ Date: ____________ Start time: ____________ Stop time: ____________ Movements: ____________ Date: ____________ Start time: ____________ Stop time: ____________ Movements: ____________ Date: ____________ Start time: ____________ Stop time: ____________ Movements: ____________ Date: ____________ Start time: ____________ Stop time: ____________ Movements: ____________ Date: ____________ Start time: ____________ Stop time: ____________ Movements: ____________ Date: ____________ Start time: ____________ Stop time: ____________ Movements: ____________ This information is not intended to replace advice given to you by your health care provider. Make sure you discuss any questions you have with your health care provider. Document Revised: 10/23/2018 Document Reviewed: 10/23/2018 Elsevier Patient Education  2020 Elsevier Inc.  

## 2019-12-11 NOTE — Progress Notes (Signed)
   PRENATAL VISIT NOTE  Subjective:  Sierra Shannon is a 24 y.o. G3P0020 at 100w6d being seen today for ongoing prenatal care.  She is currently monitored for the following issues for this low-risk pregnancy and has Supervision of other normal pregnancy, antepartum; Genetic disorder; and Rh negative, maternal on their problem list.  Patient reports backache.  Contractions: Irritability. Vag. Bleeding: None.  Movement: Present. Denies leaking of fluid.   The following portions of the patient's history were reviewed and updated as appropriate: allergies, current medications, past family history, past medical history, past social history, past surgical history and problem list.   Objective:   Vitals:   12/11/19 1004  BP: 114/67  Pulse: 73  Weight: 137 lb 1.6 oz (62.2 kg)    Fetal Status: Fetal Heart Rate (bpm): 144 Fundal Height: 32 cm Movement: Present     General:  Alert, oriented and cooperative. Patient is in no acute distress.  Skin: Skin is warm and dry. No rash noted.   Cardiovascular: Normal heart rate noted  Respiratory: Normal respiratory effort, no problems with respiration noted  Abdomen: Soft, gravid, appropriate for gestational age.  Pain/Pressure: Present     Pelvic: Cervical exam deferred        Extremities: Normal range of motion.  Edema: Trace  Mental Status: Normal mood and affect. Normal behavior. Normal judgment and thought content.   Assessment and Plan:  Pregnancy: G3P0020 at [redacted]w[redacted]d 1. Supervision of other normal pregnancy, antepartum - Doing well - Declined MOC PP - Declines Circumcision - Anticipatory guidance for GC/CT and GBS at next visit discussed   2. Rh negative status during pregnancy in third trimester - Had Rhogam 11/13/19  3. [redacted] weeks gestation of pregnancy  Preterm labor symptoms and general obstetric precautions including but not limited to vaginal bleeding, contractions, leaking of fluid and fetal movement were reviewed in detail with the  patient. Please refer to After Visit Summary for other counseling recommendations.   Return in about 17 days (around 12/28/2019) for LOB, In-Person.  Future Appointments  Date Time Provider Department Center  12/28/2019  8:15 AM Marylene Land, CNM [facility]    Vonzella Nipple, PA-C

## 2019-12-28 ENCOUNTER — Other Ambulatory Visit: Payer: Self-pay

## 2019-12-28 ENCOUNTER — Ambulatory Visit (INDEPENDENT_AMBULATORY_CARE_PROVIDER_SITE_OTHER): Payer: Self-pay | Admitting: Student

## 2019-12-28 ENCOUNTER — Other Ambulatory Visit (HOSPITAL_COMMUNITY)
Admission: RE | Admit: 2019-12-28 | Discharge: 2019-12-28 | Disposition: A | Discharge status: Home / Self Care | Payer: Medicaid Other | Source: Ambulatory Visit | Attending: Student | Admitting: Student

## 2019-12-28 VITALS — BP 123/85 | HR 75 | Wt 141.0 lb

## 2019-12-28 DIAGNOSIS — Z7189 Other specified counseling: Secondary | ICD-10-CM

## 2019-12-28 DIAGNOSIS — Z3480 Encounter for supervision of other normal pregnancy, unspecified trimester: Secondary | ICD-10-CM | POA: Diagnosis not present

## 2019-12-28 DIAGNOSIS — Z3A36 36 weeks gestation of pregnancy: Secondary | ICD-10-CM

## 2019-12-28 DIAGNOSIS — Z3483 Encounter for supervision of other normal pregnancy, third trimester: Secondary | ICD-10-CM

## 2019-12-28 NOTE — Patient Instructions (Signed)

## 2019-12-28 NOTE — Progress Notes (Signed)
   PRENATAL VISIT NOTE  Subjective:  Sierra Shannon is a 24 y.o. G3P0020 at [redacted]w[redacted]d being seen today for ongoing prenatal care.  She is currently monitored for the following issues for this low-risk pregnancy and has Supervision of other normal pregnancy, antepartum; Genetic disorder; and Rh negative, maternal on their problem list.  Patient reports no complaints.  Contractions: Irritability. Vag. Bleeding: None.  Movement: Present. Denies leaking of fluid.   The following portions of the patient's history were reviewed and updated as appropriate: allergies, current medications, past family history, past medical history, past social history, past surgical history and problem list.   Objective:   Vitals:   12/28/19 0819  BP: 123/85  Pulse: 75  Weight: 141 lb (64 kg)    Fetal Status: Fetal Heart Rate (bpm): 132 Fundal Height: 36 cm Movement: Present  Presentation: Vertex  General:  Alert, oriented and cooperative. Patient is in no acute distress.  Skin: Skin is warm and dry. No rash noted.   Cardiovascular: Normal heart rate noted  Respiratory: Normal respiratory effort, no problems with respiration noted  Abdomen: Soft, gravid, appropriate for gestational age.  Pain/Pressure: Absent     Pelvic: Cervical exam performed in the presence of a chaperone   Effacement (%): 0 Station: Ballotable  Extremities: Normal range of motion.  Edema: Trace  Mental Status: Normal mood and affect. Normal behavior. Normal judgment and thought content.   Assessment and Plan:  Pregnancy: G3P0020 at [redacted]w[redacted]d 1. Supervision of other normal pregnancy, antepartum -Patient is vertex by [place] -Discussed signs and symptoms of labor   - COVID-19 Vaccine Counseling: The patient was counseled on the potential benefits and lack of known risks of COVID vaccination, during pregnancy and breastfeeding, during today's visit. The patient's questions and concerns were addressed today, including safety of the  vaccination and potential side effects as they have been published by ACOG and [facility]. The patient has been informed that there have not been any documented vaccine related injuries, deaths or birth defects to infant or mom after receiving the COVID-19 vaccine to date. The patient has been made aware that although she is not at increased risk of contracting COVID-19 during pregnancy, she is at increased risk of developing severe disease and complications if she contracts COVID-19 while pregnant. All patient questions were addressed during our visit today. The patient is still unsure of her decision for vaccination.      - Culture, beta strep (group b only) - GC/Chlamydia probe amp ([place])not at [facility]  Preterm labor symptoms and general obstetric precautions including but not limited to vaginal bleeding, contractions, leaking of fluid and fetal movement were reviewed in detail with the patient. Please refer to After Visit Summary for other counseling recommendations.   Return in about 2 weeks (around 01/11/2020), or LROB in person with KK.  Future Appointments  Date Time Provider Department Center  01/12/2020  8:35 AM Marylene Land, CNM [facility]    Charlesetta Garibaldi [place], [place]

## 2019-12-29 LAB — GC/CHLAMYDIA PROBE AMP (~~LOC~~) NOT AT ARMC
Chlamydia: NEGATIVE
Comment: NEGATIVE
Comment: NORMAL
Neisseria Gonorrhea: NEGATIVE

## 2019-12-31 LAB — CULTURE, BETA STREP (GROUP B ONLY): Strep Gp B Culture: POSITIVE — AB

## 2020-01-04 ENCOUNTER — Encounter: Payer: Self-pay | Admitting: Lactation Services

## 2020-01-04 DIAGNOSIS — O99820 Streptococcus B carrier state complicating pregnancy: Secondary | ICD-10-CM | POA: Insufficient documentation

## 2020-01-05 ENCOUNTER — Encounter (HOSPITAL_COMMUNITY): Payer: Self-pay | Admitting: Obstetrics & Gynecology

## 2020-01-05 ENCOUNTER — Other Ambulatory Visit: Payer: Self-pay

## 2020-01-05 ENCOUNTER — Inpatient Hospital Stay (HOSPITAL_COMMUNITY)
Admission: AD | Admit: 2020-01-05 | Discharge: 2020-01-05 | Disposition: A | Discharge status: Home / Self Care | Payer: Medicaid Other | Attending: Obstetrics & Gynecology | Admitting: Obstetrics & Gynecology

## 2020-01-05 DIAGNOSIS — Q999 Chromosomal abnormality, unspecified: Secondary | ICD-10-CM

## 2020-01-05 DIAGNOSIS — Z3480 Encounter for supervision of other normal pregnancy, unspecified trimester: Secondary | ICD-10-CM | POA: Diagnosis not present

## 2020-01-05 DIAGNOSIS — O479 False labor, unspecified: Secondary | ICD-10-CM

## 2020-01-05 DIAGNOSIS — Z3A37 37 weeks gestation of pregnancy: Secondary | ICD-10-CM

## 2020-01-05 NOTE — Discharge Instructions (Signed)
Braxton Hicks Contractions °Contractions of the uterus can occur throughout pregnancy, but they are not always a sign that you are in labor. You may have practice contractions called Braxton Hicks contractions. These false labor contractions are sometimes confused with true labor. °What are Braxton Hicks contractions? °Braxton Hicks contractions are tightening movements that occur in the muscles of the uterus before labor. Unlike true labor contractions, these contractions do not result in opening (dilation) and thinning of the cervix. Toward the end of pregnancy (32-34 weeks), Braxton Hicks contractions can happen more often and may become stronger. These contractions are sometimes difficult to tell apart from true labor because they can be very uncomfortable. You should not feel embarrassed if you go to the hospital with false labor. °Sometimes, the only way to tell if you are in true labor is for your health care provider to look for changes in the cervix. The health care provider will do a physical exam and may monitor your contractions. If you are not in true labor, the exam should show that your cervix is not dilating and your water has not broken. °If there are no other health problems associated with your pregnancy, it is completely safe for you to be sent home with false labor. You may continue to have Braxton Hicks contractions until you go into true labor. °How to tell the difference between true labor and false labor °True labor °· Contractions last 30-70 seconds. °· Contractions become very regular. °· Discomfort is usually felt in the top of the uterus, and it spreads to the lower abdomen and low back. °· Contractions do not go away with walking. °· Contractions usually become more intense and increase in frequency. °· The cervix dilates and gets thinner. °False labor °· Contractions are usually shorter and not as strong as true labor contractions. °· Contractions are usually irregular. °· Contractions  are often felt in the front of the lower abdomen and in the groin. °· Contractions may go away when you walk around or change positions while lying down. °· Contractions get weaker and are shorter-lasting as time goes on. °· The cervix usually does not dilate or become thin. °Follow these instructions at home: ° °· Take over-the-counter and prescription medicines only as told by your health care provider. °· Keep up with your usual exercises and follow other instructions from your health care provider. °· Eat and drink lightly if you think you are going into labor. °· If Braxton Hicks contractions are making you uncomfortable: °? Change your position from lying down or resting to walking, or change from walking to resting. °? Sit and rest in a tub of warm water. °? Drink enough fluid to keep your urine pale yellow. Dehydration may cause these contractions. °? Do slow and deep breathing several times an hour. °· Keep all follow-up prenatal visits as told by your health care provider. This is important. °Contact a health care provider if: °· You have a fever. °· You have continuous pain in your abdomen. °Get help right away if: °· Your contractions become stronger, more regular, and closer together. °· You have fluid leaking or gushing from your vagina. °· You pass blood-tinged mucus (bloody show). °· You have bleeding from your vagina. °· You have low back pain that you never had before. °· You feel your baby’s head pushing down and causing pelvic pressure. °· Your baby is not moving inside you as much as it used to. °Summary °· Contractions that occur before labor are   called Braxton Hicks contractions, false labor, or practice contractions. °· Braxton Hicks contractions are usually shorter, weaker, farther apart, and less regular than true labor contractions. True labor contractions usually become progressively stronger and regular, and they become more frequent. °· Manage discomfort from Braxton Hicks contractions  by changing position, resting in a warm bath, drinking plenty of water, or practicing deep breathing. °This information is not intended to replace advice given to you by your health care provider. Make sure you discuss any questions you have with your health care provider. °Document Revised: 02/15/2017 Document Reviewed: 07/19/2016 °Elsevier Patient Education © 2020 [employer] ° °

## 2020-01-05 NOTE — MAU Note (Signed)
Pt reports she started having increased pelvic pressure and cramping every few minutes. Denies any vag bleeding or leaking at this time. Good fetal movement felt

## 2020-01-12 ENCOUNTER — Ambulatory Visit (INDEPENDENT_AMBULATORY_CARE_PROVIDER_SITE_OTHER): Payer: Self-pay | Admitting: Student

## 2020-01-12 ENCOUNTER — Other Ambulatory Visit: Payer: Self-pay

## 2020-01-12 VITALS — BP 127/88 | HR 66 | Wt 143.0 lb

## 2020-01-12 DIAGNOSIS — Z3A38 38 weeks gestation of pregnancy: Secondary | ICD-10-CM

## 2020-01-12 DIAGNOSIS — Z3480 Encounter for supervision of other normal pregnancy, unspecified trimester: Secondary | ICD-10-CM

## 2020-01-12 NOTE — Patient Instructions (Signed)

## 2020-01-12 NOTE — Progress Notes (Signed)
   PRENATAL VISIT NOTE  Subjective:  Sierra Shannon is a 24 y.o. G3P0020 at [redacted]w[redacted]d being seen today for ongoing prenatal care.  She is currently monitored for the following issues for this low-risk pregnancy and has Supervision of other normal pregnancy, antepartum; Genetic disorder; Rh negative, maternal; and GBS (group B Streptococcus carrier), +RV culture, currently pregnant on their problem list.  Patient reports no complaints. She was seen in MAU on 10/19 for contractions, was sent home after no cervical change.  Contractions: Irritability. Vag. Bleeding: None.  Movement: Present. Denies leaking of fluid.   The following portions of the patient's history were reviewed and updated as appropriate: allergies, current medications, past family history, past medical history, past social history, past surgical history and problem list.   Objective:   Vitals:   01/12/20 0921  BP: 127/88  Pulse: 66  Weight: 143 lb (64.9 kg)    Fetal Status: Fetal Heart Rate (bpm): 134 Fundal Height: 38 cm Movement: Present     General:  Alert, oriented and cooperative. Patient is in no acute distress.  Skin: Skin is warm and dry. No rash noted.   Cardiovascular: Normal heart rate noted  Respiratory: Normal respiratory effort, no problems with respiration noted  Abdomen: Soft, gravid, appropriate for gestational age.  Pain/Pressure: Present     Pelvic: Cervical exam performed in the presence of a chaperone   Effacement (%): 0    Extremities: Normal range of motion.  Edema: Trace  Mental Status: Normal mood and affect. Normal behavior. Normal judgment and thought content.   Assessment and Plan:  Pregnancy: G3P0020 at [redacted]w[redacted]d 1. Supervision of other normal pregnancy, antepartum -Patient doing well, reviewed signs of labor -Discussed that we will schedule IOL at 41 weeks; we will discuss method of induction next week -Discussed GBS positive test result and that she will need abx in labor.  Preterm labor  symptoms and general obstetric precautions including but not limited to vaginal bleeding, contractions, leaking of fluid and fetal movement were reviewed in detail with the patient. Please refer to After Visit Summary for other counseling recommendations.   Return in about 1 week (around 01/19/2020), or lrob in person with KK.  Future Appointments  Date Time Provider Department Center  01/22/2020  9:55 AM Marylene Land, CNM [facility]    [address] [place], [place]

## 2020-01-22 ENCOUNTER — Other Ambulatory Visit: Payer: Self-pay

## 2020-01-22 ENCOUNTER — Encounter (HOSPITAL_COMMUNITY): Payer: Self-pay | Admitting: Student

## 2020-01-22 ENCOUNTER — Inpatient Hospital Stay (HOSPITAL_COMMUNITY)
Admission: AD | Admit: 2020-01-22 | Discharge: 2020-01-26 | DRG: 807 | Disposition: A | Discharge status: Home / Self Care | Payer: Medicaid Other | Attending: Obstetrics and Gynecology | Admitting: Obstetrics and Gynecology

## 2020-01-22 ENCOUNTER — Ambulatory Visit (INDEPENDENT_AMBULATORY_CARE_PROVIDER_SITE_OTHER): Payer: Self-pay | Admitting: Student

## 2020-01-22 VITALS — BP 144/89 | HR 81 | Wt 147.0 lb

## 2020-01-22 DIAGNOSIS — B951 Streptococcus, group B, as the cause of diseases classified elsewhere: Secondary | ICD-10-CM

## 2020-01-22 DIAGNOSIS — Z3A39 39 weeks gestation of pregnancy: Secondary | ICD-10-CM

## 2020-01-22 DIAGNOSIS — O9902 Anemia complicating childbirth: Secondary | ICD-10-CM | POA: Diagnosis present

## 2020-01-22 DIAGNOSIS — O99820 Streptococcus B carrier state complicating pregnancy: Secondary | ICD-10-CM

## 2020-01-22 DIAGNOSIS — Z6791 Unspecified blood type, Rh negative: Secondary | ICD-10-CM | POA: Diagnosis not present

## 2020-01-22 DIAGNOSIS — O99824 Streptococcus B carrier state complicating childbirth: Secondary | ICD-10-CM | POA: Diagnosis present

## 2020-01-22 DIAGNOSIS — O139 Gestational [pregnancy-induced] hypertension without significant proteinuria, unspecified trimester: Secondary | ICD-10-CM | POA: Diagnosis present

## 2020-01-22 DIAGNOSIS — O134 Gestational [pregnancy-induced] hypertension without significant proteinuria, complicating childbirth: Principal | ICD-10-CM | POA: Diagnosis present

## 2020-01-22 DIAGNOSIS — R030 Elevated blood-pressure reading, without diagnosis of hypertension: Secondary | ICD-10-CM | POA: Insufficient documentation

## 2020-01-22 DIAGNOSIS — O26899 Other specified pregnancy related conditions, unspecified trimester: Secondary | ICD-10-CM

## 2020-01-22 DIAGNOSIS — D509 Iron deficiency anemia, unspecified: Secondary | ICD-10-CM | POA: Diagnosis present

## 2020-01-22 DIAGNOSIS — Z20822 Contact with and (suspected) exposure to covid-19: Secondary | ICD-10-CM | POA: Diagnosis present

## 2020-01-22 DIAGNOSIS — O133 Gestational [pregnancy-induced] hypertension without significant proteinuria, third trimester: Secondary | ICD-10-CM

## 2020-01-22 DIAGNOSIS — Q999 Chromosomal abnormality, unspecified: Secondary | ICD-10-CM

## 2020-01-22 DIAGNOSIS — O26893 Other specified pregnancy related conditions, third trimester: Secondary | ICD-10-CM | POA: Diagnosis present

## 2020-01-22 DIAGNOSIS — Z3480 Encounter for supervision of other normal pregnancy, unspecified trimester: Secondary | ICD-10-CM

## 2020-01-22 LAB — COMPREHENSIVE METABOLIC PANEL
ALT: 22 U/L (ref 0–44)
AST: 25 U/L (ref 15–41)
Albumin: 3.1 g/dL — ABNORMAL LOW (ref 3.5–5.0)
Alkaline Phosphatase: 371 U/L — ABNORMAL HIGH (ref 38–126)
Anion gap: 10 (ref 5–15)
BUN: 6 mg/dL (ref 6–20)
CO2: 20 mmol/L — ABNORMAL LOW (ref 22–32)
Calcium: 9.1 mg/dL (ref 8.9–10.3)
Chloride: 105 mmol/L (ref 98–111)
Creatinine, Ser: 0.76 mg/dL (ref 0.44–1.00)
GFR, Estimated: >60 mL/min (ref >60)
Glucose, Bld: 80 mg/dL (ref 70–99)
Potassium: 3.8 mmol/L (ref 3.5–5.1)
Sodium: 135 mmol/L (ref 135–145)
Total Bilirubin: 0.4 mg/dL (ref 0.3–1.2)
Total Protein: 7.1 g/dL (ref 6.5–8.1)

## 2020-01-22 LAB — CBC
HCT: 34.9 % — ABNORMAL LOW (ref 36.0–46.0)
Hemoglobin: 10.6 g/dL — ABNORMAL LOW (ref 12.0–15.0)
MCH: 23 pg — ABNORMAL LOW (ref 26.0–34.0)
MCHC: 30.4 g/dL (ref 30.0–36.0)
MCV: 75.7 fL — ABNORMAL LOW (ref 80.0–100.0)
Platelets: 400 10*3/uL (ref 150–400)
RBC: 4.61 MIL/uL (ref 3.87–5.11)
RDW: 17.4 % — ABNORMAL HIGH (ref 11.5–15.5)
WBC: 13.4 10*3/uL — ABNORMAL HIGH (ref 4.0–10.5)
nRBC: 0.1 % (ref 0.0–0.2)

## 2020-01-22 LAB — TYPE AND SCREEN
ABO/RH(D): O NEG
Antibody Screen: POSITIVE

## 2020-01-22 LAB — RESPIRATORY PANEL BY RT PCR (FLU A&B, COVID)
Influenza A by PCR: NEGATIVE
Influenza B by PCR: NEGATIVE
SARS Coronavirus 2 by RT PCR: NEGATIVE

## 2020-01-22 LAB — PROTEIN / CREATININE RATIO, URINE
Creatinine, Urine: 177.58 mg/dL
Protein Creatinine Ratio: 0.11 mg/mg{Cre} (ref 0.00–0.15)
Total Protein, Urine: 19 mg/dL

## 2020-01-22 MED ORDER — OXYTOCIN-SODIUM CHLORIDE 30-0.9 UT/500ML-% IV SOLN
2.5000 [IU]/h | INTRAVENOUS | Status: DC
  Filled 2020-01-22 (×2): qty 500

## 2020-01-22 MED ORDER — SODIUM CHLORIDE 0.9 % IV SOLN
5.0000 10*6.[IU] | Freq: Once | INTRAVENOUS | Status: AC
  Administered 2020-01-22: 5 10*6.[IU] via INTRAVENOUS
  Filled 2020-01-22: qty 5

## 2020-01-22 MED ORDER — OXYTOCIN BOLUS FROM INFUSION
333.0000 mL | Freq: Once | INTRAVENOUS | Status: AC
  Administered 2020-01-24: 333 mL via INTRAVENOUS

## 2020-01-22 MED ORDER — LACTATED RINGERS IV SOLN
500.0000 mL | INTRAVENOUS | Status: DC | PRN
  Administered 2020-01-24: 500 mL via INTRAVENOUS

## 2020-01-22 MED ORDER — TERBUTALINE SULFATE 1 MG/ML IJ SOLN: 0.2500 mg | Freq: Once | INTRAMUSCULAR | Status: DC | PRN

## 2020-01-22 MED ORDER — PENICILLIN G POT IN DEXTROSE 60000 UNIT/ML IV SOLN
3.0000 10*6.[IU] | INTRAVENOUS | Status: DC
  Administered 2020-01-22 – 2020-01-24 (×11): 3 10*6.[IU] via INTRAVENOUS
  Filled 2020-01-22 (×10): qty 50

## 2020-01-22 MED ORDER — SOD CITRATE-CITRIC ACID 500-334 MG/5ML PO SOLN: 30.0000 mL | ORAL | Status: DC | PRN

## 2020-01-22 MED ORDER — MISOPROSTOL 25 MCG QUARTER TABLET
25.0000 ug | ORAL_TABLET | ORAL | Status: DC | PRN
  Administered 2020-01-22 (×2): 25 ug via VAGINAL
  Filled 2020-01-22 (×3): qty 1

## 2020-01-22 MED ORDER — OXYCODONE-ACETAMINOPHEN 5-325 MG PO TABS: 1.0000 | ORAL_TABLET | ORAL | Status: DC | PRN

## 2020-01-22 MED ORDER — LIDOCAINE HCL (PF) 1 % IJ SOLN: 30.0000 mL | INTRAMUSCULAR | Status: DC | PRN

## 2020-01-22 MED ORDER — ONDANSETRON HCL 4 MG/2ML IJ SOLN
4.0000 mg | Freq: Four times a day (QID) | INTRAMUSCULAR | Status: DC | PRN
  Administered 2020-01-24: 4 mg via INTRAVENOUS
  Filled 2020-01-22: qty 2

## 2020-01-22 MED ORDER — LACTATED RINGERS IV SOLN: INTRAVENOUS | Status: DC

## 2020-01-22 MED ORDER — OXYCODONE-ACETAMINOPHEN 5-325 MG PO TABS: 2.0000 | ORAL_TABLET | ORAL | Status: DC | PRN

## 2020-01-22 MED ORDER — ACETAMINOPHEN 325 MG PO TABS: 650.0000 mg | ORAL_TABLET | ORAL | Status: DC | PRN

## 2020-01-22 NOTE — Progress Notes (Signed)
   PRENATAL VISIT NOTE  Subjective:  Sierra Shannon is a 24 y.o. G3P0020 at [redacted]w[redacted]d being seen today for ongoing prenatal care.  She is currently monitored for the following issues for this low-risk pregnancy and has Supervision of other normal pregnancy, antepartum; Genetic disorder; Rh negative, maternal; and GBS (group B Streptococcus carrier), +RV culture, currently pregnant on their problem list.  Patient reports no complaints. She denies HA, blurry vision, RUQ pain, decreased fetal movements, LOF.  Contractions: Irritability. Vag. Bleeding: None.  Movement: Present. Denies leaking of fluid.   The following portions of the patient's history were reviewed and updated as appropriate: allergies, current medications, past family history, past medical history, past social history, past surgical history and problem list.   Objective:   Vitals:   01/22/20 1027 01/22/20 1030 01/22/20 1050  BP: (!) 144/89 (!) 143/90 (!) 144/89  Pulse: 75 75 81  Weight: 147 lb (66.7 kg)      Fetal Status: Fetal Heart Rate (bpm): 138   Movement: Present     General:  Alert, oriented and cooperative. Patient is in no acute distress.  Skin: Skin is warm and dry. No rash noted.   Cardiovascular: Normal heart rate noted  Respiratory: Normal respiratory effort, no problems with respiration noted  Abdomen: Soft, gravid, appropriate for gestational age.  Pain/Pressure: Present     Pelvic: Cervical exam deferred        Extremities: Normal range of motion.  Edema: Trace  Mental Status: Normal mood and affect. Normal behavior. Normal judgment and thought content.   Assessment and Plan:  Pregnancy: G3P0020 at [redacted]w[redacted]d  1. [redacted] weeks gestation of pregnancy    -Patient with new onset elevated blood pressures.  -Discussed with Dr. Jolayne Panther, will direct admit for delivery due to gestational hypertension and fact that patient will be 40 weeks tomorrow -Labor team notified; orders placed.  -confirmed vertex by [place] in  office.  Term labor symptoms and general obstetric precautions including but not limited to vaginal bleeding, contractions, leaking of fluid and fetal movement were reviewed in detail with the patient. Please refer to After Visit Summary for other counseling recommendations.   No follow-ups on file.  No future appointments.  Marylene Land, CNM

## 2020-01-22 NOTE — Progress Notes (Signed)
Labor Progress Note Sierra Shannon is a 24 y.o. G3P0020 at [redacted]w[redacted]d presented for IOL for gHTN.   S: Doing well, no complaints. Resting comfortably in bed eating italian ice.   O:  BP (!) 143/89   Pulse 78   Temp 98.2 F (36.8 C) (Oral)   Ht 5\' 2"  (1.575 m)   Wt 66.7 kg   LMP 04/02/2019 (Approximate)   BMI 26.89 kg/m  EFM: baseline 135 bpm /moderate variability / accels present, no decels  CVE: Dilation: Closed Effacement (%): 50 Station: -2 Presentation: Vertex Exam by:: J.Cox, RN   A&P: 24 y.o. 21 [redacted]w[redacted]d presented for IOL for gHTN.  #Labor: Initial exam closed/50/-2. S/p cytotec x1 at 1436. Will reassess around 1830.  #Pain: IV fentanyl prn #FWB: Cat 1 #GBS positive > PCN started #gHTN: monitor BP, f/u PEC labs when resulted #Rh negative: plan for postpartum rhogam eval  [redacted]w[redacted]d, MD 3:56 PM

## 2020-01-22 NOTE — H&P (Signed)
OBSTETRIC ADMISSION HISTORY AND PHYSICAL  Sierra Shannon is a 23 y.o. female G3P0020 with IUP at [redacted]w[redacted]d by [redacted]w[redacted]d [place] presenting for IOL for gHTN. She was seen in clinic today and noted to have newly elevated blood pressures. Confirmed cephalic by [place].   She reports +FMs, No LOF, no VB, no blurry vision, headaches or peripheral edema, and RUQ pain.  She plans on breast feeding. She is undecided on  birth control. She received her prenatal care at [facility]   Dating: By [redacted]w[redacted]d [place] --->  Estimated Date of Delivery: 01/23/20  Sono:    @[redacted]w[redacted]d , CWD, normal anatomy, transverse lie, 417g, 32% EFW   Prenatal History/Complications:  -Rh Neg -GBS pos -gHTN, newly diagnosed today    Past Medical History: Past Medical History:  Diagnosis Date  . Medical history non-contributory     Past Surgical History: Past Surgical History:  Procedure Laterality Date  . NO PAST SURGERIES      Obstetrical History: OB History    Gravida  3   Para      Term      Preterm      AB  2   Living  0     SAB  2   TAB      Ectopic      Multiple      Live Births              Social History Social History   Socioeconomic History  . Marital status: Single    Spouse name: Not on file  . Number of children: Not on file  . Years of education: Not on file  . Highest education level: 10th grade  Occupational History  . Occupation: unemployed  Tobacco Use  . Smoking status: Never Smoker  . Smokeless tobacco: Never Used  Vaping Use  . Vaping Use: Former  Substance and Sexual Activity  . Alcohol use: No  . Drug use: Never  . Sexual activity: Yes    Birth control/protection: None  Other Topics Concern  . Not on file  Social History Narrative  . Not on file   Social Determinants of Health   Financial Resource Strain: Low Risk   . Difficulty of Paying Living Expenses: Not hard at all  Food Insecurity: No Food Insecurity  . Worried About in the Last Year: Never true   . Ran Out of Food in the Last Year: Never true  Transportation Needs: No Transportation Needs  . Lack of Transportation (Medical): No  . Lack of Transportation (Non-Medical): No  Physical Activity: Inactive  . Days of Exercise per Week: 0 days  . Minutes of Exercise per Session: 0 min  Stress: No Stress Concern Present  . Feeling of Stress : Only a little  Social Connections: Moderately Isolated  . Frequency of Communication with Friends and Family: More than three times a week  . Frequency of Social Gatherings with Friends and Family: More than three times a week  . Attends Religious Services: Never  . Active Member of Clubs or Organizations: No  . Attends Programme researcher, broadcasting/film/video Meetings: Never  . Marital Status: Living with partner    Family History: History reviewed. No pertinent family history.  Allergies: No Known Allergies  Medications Prior to Admission  Medication Sig Dispense Refill Last Dose  . acetaminophen (TYLENOL) 500 MG tablet Take 2 tablets (1,000 mg total) by mouth every 6 (six) hours as needed for headache. 60 tablet 0   .  Prenatal Vit-Fe Fumarate-FA (PRENATAL VITAMINS PO) Take 1 tablet by mouth.         Review of Systems   All systems reviewed and negative except as stated in HPI  Blood pressure (!) 143/89, pulse 78, temperature 98.2 F (36.8 C), temperature source Oral, height 5\' 2"  (1.575 m), weight 66.7 kg, last menstrual period 04/02/2019, unknown if currently breastfeeding. General appearance: alert, cooperative and appears stated age Lungs: clear to auscultation bilaterally Heart: regular rate and rhythm Abdomen: soft, non-tender; bowel sounds normal Extremities: Homans sign is negative, no sign of DVT Presentation: cephalic Fetal monitoring: baseline 130, mod variability, pos accels, neg decels Uterine activity: irregular, q5-10 min  Dilation: Closed Effacement (%): 50 Station: -2 Exam by:: J.Cox, RN   Prenatal labs: ABO, Rh: --/--/PENDING  (11/05 1348) Antibody: PENDING (11/05 1348) Rubella: 2.98 (04/28 0943) RPR: Non Reactive (08/23 0833)  HBsAg: Negative (04/28 0943)  HIV: Non Reactive (08/23 0833)  GBS: Positive/-- (10/11 0922)  2 hr Glucola normal Genetic screening  normal Anatomy 02[birth year]2005 normal  Prenatal Transfer Tool  Maternal Diabetes: No Genetic Screening: Normal Maternal Ultrasounds/Referrals: Normal Fetal Ultrasounds or other Referrals:  None Maternal Substance Abuse:  No Significant Maternal Medications:  None Significant Maternal Lab Results: Group B Strep negative and Rh negative  Results for orders placed or performed during the hospital encounter of 01/22/20 (from the past 24 hour(s))  Type and screen   Collection Time: 01/22/20  1:48 PM  Result Value Ref Range   ABO/RH(D) PENDING    Antibody Screen PENDING    Sample Expiration      01/25/2020,2359 Performed at [facility], [address] [place], [place]   CBC   Collection Time: 01/22/20  2:34 PM  Result Value Ref Range   WBC 13.4 (H) 4.0 - 10.5 K/uL   RBC 4.61 3.87 - 5.11 MIL/uL   Hemoglobin 10.6 (L) 12.0 - 15.0 g/dL   HCT 13/05/21 (L) 36 - 46 %   MCV 75.7 (L) 80.0 - 100.0 fL   MCH 23.0 (L) 26.0 - 34.0 pg   MCHC 30.4 30.0 - 36.0 g/dL   RDW 28.7 (H) 86.7 - 67.2 %   Platelets 400 150 - 400 K/uL   nRBC 0.1 0.0 - 0.2 %    Patient Active Problem List   Diagnosis Date Noted  . Transient hypertension 01/22/2020  . Gestational hypertension 01/22/2020  . GBS (group B Streptococcus carrier), +RV culture, currently pregnant 01/04/2020  . Rh negative, maternal 11/12/2019  . Genetic disorder 07/23/2019  . Supervision of other normal pregnancy, antepartum 06/30/2019    Assessment/Plan:  Sierra Shannon is a 24 y.o. G3P0020 at [redacted]w[redacted]d here for IOL for gHTN.   #Induction of Labor: initial exam closed/50/-2. Start with cytotec and reassess in four hours, FB when able.   #gHTN: new diagnosis, MR pressures on arrival. Obtain PEC labs,  urine P:C #rh negative: rhogam eval postpartum  #Pain: Epidural, pain meds prn #FWB: Cat I  #ID:  GBS positive, PCN  #MOF: breast #MOC:undecided #Circ:  No   [redacted]w[redacted]d, MD  01/22/2020, 2:54 PM

## 2020-01-23 ENCOUNTER — Inpatient Hospital Stay (HOSPITAL_COMMUNITY): Payer: Medicaid Other | Admitting: Anesthesiology

## 2020-01-23 ENCOUNTER — Encounter (HOSPITAL_COMMUNITY): Payer: Self-pay | Admitting: Obstetrics and Gynecology

## 2020-01-23 LAB — RPR: RPR Ser Ql: NONREACTIVE

## 2020-01-23 MED ORDER — PHENYLEPHRINE 40 MCG/ML (10ML) SYRINGE FOR IV PUSH (FOR BLOOD PRESSURE SUPPORT): 80.0000 ug | PREFILLED_SYRINGE | INTRAVENOUS | Status: DC | PRN

## 2020-01-23 MED ORDER — PHENYLEPHRINE 40 MCG/ML (10ML) SYRINGE FOR IV PUSH (FOR BLOOD PRESSURE SUPPORT)
80.0000 ug | PREFILLED_SYRINGE | INTRAVENOUS | Status: DC | PRN
  Administered 2020-01-24: 80 ug via INTRAVENOUS

## 2020-01-23 MED ORDER — EPHEDRINE 5 MG/ML INJ
10.0000 mg | INTRAVENOUS | Status: DC | PRN
  Filled 2020-01-23: qty 10

## 2020-01-23 MED ORDER — FENTANYL CITRATE (PF) 100 MCG/2ML IJ SOLN
100.0000 ug | Freq: Once | INTRAMUSCULAR | Status: AC
  Administered 2020-01-23: 100 ug via INTRAVENOUS
  Filled 2020-01-23: qty 2

## 2020-01-23 MED ORDER — OXYTOCIN-SODIUM CHLORIDE 30-0.9 UT/500ML-% IV SOLN
1.0000 m[IU]/min | INTRAVENOUS | Status: DC
  Administered 2020-01-23: 2 m[IU]/min via INTRAVENOUS
  Administered 2020-01-24: 30 m[IU]/min via INTRAVENOUS
  Filled 2020-01-23: qty 500

## 2020-01-23 MED ORDER — FENTANYL-BUPIVACAINE-NACL 0.5-0.125-0.9 MG/250ML-% EP SOLN
12.0000 mL/h | EPIDURAL | Status: DC | PRN
  Administered 2020-01-24: 12 mL/h via EPIDURAL
  Filled 2020-01-23 (×2): qty 250

## 2020-01-23 MED ORDER — FENTANYL-BUPIVACAINE-NACL 0.5-0.125-0.9 MG/250ML-% EP SOLN
EPIDURAL | Status: AC
  Filled 2020-01-23: qty 250

## 2020-01-23 MED ORDER — LACTATED RINGERS IV SOLN
500.0000 mL | Freq: Once | INTRAVENOUS | Status: AC
  Administered 2020-01-23: 500 mL via INTRAVENOUS

## 2020-01-23 MED ORDER — EPHEDRINE 5 MG/ML INJ
10.0000 mg | INTRAVENOUS | Status: DC | PRN
  Administered 2020-01-24: 10 mg via INTRAVENOUS

## 2020-01-23 MED ORDER — TERBUTALINE SULFATE 1 MG/ML IJ SOLN: 0.2500 mg | Freq: Once | INTRAMUSCULAR | Status: DC | PRN

## 2020-01-23 MED ORDER — DIPHENHYDRAMINE HCL 50 MG/ML IJ SOLN: 12.5000 mg | INTRAMUSCULAR | Status: DC | PRN

## 2020-01-23 MED ORDER — FENTANYL CITRATE (PF) 100 MCG/2ML IJ SOLN
100.0000 ug | INTRAMUSCULAR | Status: DC | PRN
  Administered 2020-01-23: 100 ug via INTRAVENOUS
  Filled 2020-01-23: qty 2

## 2020-01-23 NOTE — Anesthesia Preprocedure Evaluation (Signed)
Anesthesia Evaluation  Patient identified by MRN, date of birth, ID band Patient awake    Airway Mallampati: II  TM Distance: >3 FB Neck ROM: Full    Dental no notable dental hx. (+) Teeth Intact   Pulmonary neg pulmonary ROS,    Pulmonary exam normal breath sounds clear to auscultation       Cardiovascular hypertension, Normal cardiovascular exam Rhythm:Regular Rate:Normal     Neuro/Psych negative neurological ROS  negative psych ROS   GI/Hepatic Neg liver ROS, GERD  ,  Endo/Other  negative endocrine ROS  Renal/GU negative Renal ROS  negative genitourinary   Musculoskeletal negative musculoskeletal ROS (+)   Abdominal   Peds  Hematology  (+) anemia ,   Anesthesia Other Findings   Reproductive/Obstetrics (+) Pregnancy 40 2/[redacted] weeks Gestational HTN                             Anesthesia Physical Anesthesia Plan  ASA: II  Anesthesia Plan: Epidural   Post-op Pain Management:    Induction:   PONV Risk Score and Plan:   Airway Management Planned: Natural Airway  Additional Equipment:   Intra-op Plan:   Post-operative Plan:   Informed Consent: I have reviewed the patients History and Physical, chart, labs and discussed the procedure including the risks, benefits and alternatives for the proposed anesthesia with the patient or authorized representative who has indicated his/her understanding and acceptance.       Plan Discussed with: Anesthesiologist  Anesthesia Plan Comments:         Anesthesia Quick Evaluation

## 2020-01-23 NOTE — Progress Notes (Signed)
Sierra Shannon is a 24 y.o. G3P0020 at [redacted]w[redacted]d.  Subjective: Mild cramping  Objective: BP 125/74   Pulse 65   Temp 98 F (36.7 C) (Oral)   Resp 18   Ht 5\' 2"  (1.575 m)   Wt 66.7 kg   LMP 04/02/2019 (Approximate)   BMI 26.89 kg/m    FHT:  FHR: 125 bpm, variability: mod,  accelerations:  15x15,  decelerations:  none UC:   Q 1-5 minutes, mild Dilation: 4 Effacement (%): 50 Cervical Position: Middle Station: -3 Presentation: Vertex Exam by:: A. [email or web address], RN  Labs: NA  Assessment / Plan: [redacted]w[redacted]d week IUP Labor: Early Fetal Wellbeing:  Category I Pain Control:  None Anticipated MOD:  SVD Start pitocin  [redacted]w[redacted]d, Katrinka Blazing, CNM 01/23/2020 9:56 AM

## 2020-01-23 NOTE — Plan of Care (Signed)
  Problem: Education: Goal: Ability to make informed decisions regarding treatment and plan of care will improve Outcome: Progressing Goal: Ability to state and carry out methods to decrease the pain will improve Outcome: Progressing   Problem: Safety: Goal: Risk of complications during labor and delivery will decrease Outcome: Progressing   Problem: Pain Management: Goal: Relief or control of pain from uterine contractions will improve Outcome: Progressing

## 2020-01-23 NOTE — Progress Notes (Signed)
Sierra Shannon is a 24 y.o. G3P0020 at [redacted]w[redacted]d.  Subjective: Mild-mod cramping w/ UC's   Objective: BP 124/78   Pulse 81   Temp 98.2 F (36.8 C) (Oral)   Resp 18   Ht 5\' 2"  (1.575 m)   Wt 66.7 kg   LMP 04/02/2019 (Approximate)   BMI 26.89 kg/m    FHT:  FHR: 135 bpm, variability: mod,  accelerations:  15x15,  decelerations:  none UC:   Q 1-5 minutes, mild-mod Dilation: 4 Effacement (%): 50 Cervical Position: Middle Station: -2 Presentation: Vertex verified by 04/04/2019 Exam by:: [email or web address], CNM  Labs: No results found for this or any previous visit (from the past 24 hour(s)).  Assessment / Plan: [redacted]w[redacted]d week IUP Labor: Early/IOL Fetal Wellbeing:  Category I Pain Control:  Comfort measures Anticipated MOD:  SVD Titrate pitocin to achieve adequate contractions. Consider AROM at next VE.   [redacted]w[redacted]d, Katrinka Blazing, CNM 01/23/2020 2:41 PM

## 2020-01-23 NOTE — Progress Notes (Addendum)
LABOR PROGRESS NOTE  Sierra Shannon is a 24 y.o. G3P0020 at [redacted]w[redacted]d  admitted for IOL 2/2 gHTN.   Subjective: Doing well. Intermittently having discomfort with contractions. Experiencing dizziness. Would like IV pain medication and zofran at this time.   Objective: BP 127/80   Pulse 71   Temp 98 F (36.7 C) (Oral)   Resp 17   Ht 5\' 2"  (1.575 m)   Wt 66.7 kg   LMP 04/02/2019 (Approximate)   BMI 26.89 kg/m  or  Vitals:   01/22/20 2210 01/23/20 0005 01/23/20 0134 01/23/20 0427  BP: 128/82 120/72 114/75 127/80  Pulse: 72 65 74 71  Resp: 17 17 17 17   Temp:   98.6 F (37 C) 98 F (36.7 C)  TempSrc:   Oral Oral  Weight:      Height:       Dilation: 2 Effacement (%): Thick Station: -3 Presentation: Vertex Exam by:: Dr. , Resident FHT: baseline rate 125 bpm, moderate varibility, 15 x 15 acel, no decels Toco: minimal, irritability   Labs: Lab Results  Component Value Date   WBC 13.4 (H) 01/22/2020   HGB 10.6 (L) 01/22/2020   HCT 34.9 (L) 01/22/2020   MCV 75.7 (L) 01/22/2020   PLT 400 01/22/2020    Patient Active Problem List   Diagnosis Date Noted  . Transient hypertension 01/22/2020  . Gestational hypertension 01/22/2020  . GBS (group B Streptococcus carrier), +RV culture, currently pregnant 01/04/2020  . Rh negative, maternal 11/12/2019  . Genetic disorder 07/23/2019  . Supervision of other normal pregnancy, antepartum 06/30/2019    Assessment / Plan: 24 y.o. G3P0020 at [redacted]w[redacted]d here for IOL 2/2 gHTN  Labor: FB in place, s/p cytotec x2; start pitocin Fetal Wellbeing:  Cat I  Pain Control:  Maternally supported; IV fentanyl 100 mcg x1, can have epidural when desired.  Anticipated MOD:  Vaginal  Simone Autry-Lott, DO 01/23/2020, 7:20 AM PGY-2, [facility]

## 2020-01-23 NOTE — Progress Notes (Addendum)
LABOR PROGRESS NOTE  Sierra Shannon is a 24 y.o. G3P0020 at [redacted]w[redacted]d  admitted for IOL 2/2 gHTN.   Subjective: Endorsing discomfort with contractions.   Objective: BP (!) 143/86   Pulse 79   Temp 98.2 F (36.8 C) (Oral)   Resp 18   Ht 5\' 2"  (1.575 m)   Wt 66.7 kg   LMP 04/02/2019 (Approximate)   BMI 26.89 kg/m  or  Vitals:   01/23/20 1832 01/23/20 1901 01/23/20 1930 01/23/20 2000  BP: 130/80 136/83 (!) 141/84 (!) 143/86  Pulse: 75 71 73 79  Resp:      Temp:      TempSrc:      Weight:      Height:       Dilation: 4 Effacement (%): 60 Cervical Position: Middle Station: -2 Presentation: Vertex Exam by:: V. Smity, CNM FHT: baseline rate 145 bpm, moderate varibility, 10 x 10 acel, early decels Toco: 2-3 mins  Labs: Lab Results  Component Value Date   WBC 13.4 (H) 01/22/2020   HGB 10.6 (L) 01/22/2020   HCT 34.9 (L) 01/22/2020   MCV 75.7 (L) 01/22/2020   PLT 400 01/22/2020    Patient Active Problem List   Diagnosis Date Noted   Transient hypertension 01/22/2020   Gestational hypertension 01/22/2020   GBS (group B Streptococcus carrier), +RV culture, currently pregnant 01/04/2020   Rh negative, maternal 11/12/2019   Genetic disorder 07/23/2019   Supervision of other normal pregnancy, antepartum 06/30/2019    Assessment / Plan: 24 y.o. G3P0020 at [redacted]w[redacted]d here for IOL 2/2 gHTN.  Labor: Cervical check unchanged from last. Asynclitic presentation via bedside [redacted]w[redacted]d. Pit 26 mu/min (1810), s/p FB, AROM (1800) Fetal Wellbeing:  Cat I strip Pain Control:  Maternally supported, IV fentanyl given during this encounter. Patient can have epidural when desired Anticipated MOD: Vaginal GHTN: Most recent BP 143/86 with previous normal range pressures.  -PEC wnl  [place], DO 01/23/2020, 9:13 PM PGY-2, [facility]  Dorothy Polhemus, 13/08/2019, MD OB Fellow, Faculty Practice 01/23/2020 11:25 PM

## 2020-01-23 NOTE — Progress Notes (Signed)
Labor Progress Note Sierra Shannon is a 24 y.o. G3P0020 at [redacted]w[redacted]d presented for IOL for gHTN.  S: Doing well, resting comfortably at bed with family at bedside.  O:  BP 120/72   Pulse 65   Temp 98.4 F (36.9 C) (Oral)   Resp 17   Ht 5\' 2"  (1.575 m)   Wt 66.7 kg   LMP 04/02/2019 (Approximate)   BMI 26.89 kg/m  EFM: 145 HR/ moderate variability/ positive accels, no decels.  ToCo: 2-4 mintues  CVE: Dilation: 1.5 Effacement (%): Thick Station: -3 Presentation: Vertex Exam by:: [email or web address] DO   A&P: 24 y.o. G3P0020 [redacted]w[redacted]d IOL for gHTN #Labor: S/p cytotec x2 with last dose at 1922. S/p unsuccessful attempt at [facility] placement in s/o closed internal os. Will plan to re-attempt FB at next cervical check. Will defer additional cytotec given frequency of contractions (borderline tachysystole). #Pain: IV pain medications (pt prefers to avoid epidural) #FWB: Cat 1 strip #GBS positive receiving penicillin #Gestational hypertension: preeclampsia labs wnl on admission. Blood pressures normal to mild range. Will continue to monitor.  [facility], Medical Student 1:51 AM  Attestation of Supervision of Student:  I confirm that I have verified the information documented in the medical student's note and that I have also personally reperformed the history, physical exam and all medical decision making activities.  I have verified that all services and findings are accurately documented in this student's note; and I agree with management and plan as outlined in the documentation. I have also made any necessary editorial changes.  Jonni Sanger, MD [facility], [facility] 01/23/2020 2:30 AM

## 2020-01-23 NOTE — Progress Notes (Signed)
Sierra Shannon is a 24 y.o. G3P0020 at [redacted]w[redacted]d.  Subjective: Mild-mod contractions. Coping well.   Objective: BP 136/89   Pulse 84   Temp 98.4 F (36.9 C) (Oral)   Resp 18   Ht 5\' 2"  (1.575 m)   Wt 66.7 kg   LMP 04/02/2019 (Approximate)   BMI 26.89 kg/m    FHT:  FHR: 135 bpm, variability: mod,  accelerations:  15x15,  decelerations:  none UC:   Q 3-4 minutes, mild-mod Dilation: 4 Effacement (%): 60 Cervical Position: Middle Station: -2 Presentation: Vertex Exam by:: V. Smity, CNM  AROM mod amount of clear fluid  Labs: No results found for this or any previous visit (from the past 24 hour(s)).  Assessment / Plan: [redacted]w[redacted]d week IUP Labor: Early Fetal Wellbeing:  Category I Pain Control:  Comfort measures Anticipated MOD:  SVD Continue titrating pitocin to achieve adequate contractions.   [redacted]w[redacted]d, Katrinka Blazing, CNM 01/23/2020 6:12 PM

## 2020-01-23 NOTE — Progress Notes (Addendum)
LABOR PROGRESS NOTE  Sierra Shannon is a 24 y.o. G3P0020 at [redacted]w[redacted]d  admitted for IOL 2/2 gHTN.   Subjective: Doing well, resting comfortably.   Objective: BP 114/75   Pulse 74   Temp 98.6 F (37 C) (Oral)   Resp 17   Ht 5\' 2"  (1.575 m)   Wt 66.7 kg   LMP 04/02/2019 (Approximate)   BMI 26.89 kg/m  or  Vitals:   01/22/20 2113 01/22/20 2210 01/23/20 0005 01/23/20 0134  BP: (!) 150/77 128/82 120/72 114/75  Pulse: (!) 59 72 65 74  Resp: 17 17 17 17   Temp: 98.5 F (36.9 C)   98.6 F (37 C)  TempSrc: Oral   Oral  Weight:      Height:       Dilation: 1.5 Effacement (%): Thick Station: -3 Presentation: Vertex Exam by:: G DO FHT: baseline rate 145 bpm, moderate varibility, 15x15 acel, no decels Toco: minimal  Labs: Lab Results  Component Value Date   WBC 13.4 (H) 01/22/2020   HGB 10.6 (L) 01/22/2020   HCT 34.9 (L) 01/22/2020   MCV 75.7 (L) 01/22/2020   PLT 400 01/22/2020   Patient Active Problem List   Diagnosis Date Noted  . Transient hypertension 01/22/2020  . Gestational hypertension 01/22/2020  . GBS (group B Streptococcus carrier), +RV culture, currently pregnant 01/04/2020  . Rh negative, maternal 11/12/2019  . Genetic disorder 07/23/2019  . Supervision of other normal pregnancy, antepartum 06/30/2019   Assessment / Plan: 24 y.o. G3P0020 at [redacted]w[redacted]d here for IOL 2/2 gHTN.   gHTN BPs stable & w/in nml range. PEC labs nml.   Labor: FB inserted, s/p cytotec x2 Fetal Wellbeing:  Cat I  Pain Control:  Maternally managed; able to get IV meds/epidural when desired Anticipated MOD: Vaginal  23, DO 01/23/2020, 4:05 AM PGY-2, [facility]

## 2020-01-24 ENCOUNTER — Encounter (HOSPITAL_COMMUNITY): Payer: Self-pay | Admitting: Obstetrics and Gynecology

## 2020-01-24 DIAGNOSIS — O99824 Streptococcus B carrier state complicating childbirth: Secondary | ICD-10-CM

## 2020-01-24 DIAGNOSIS — O134 Gestational [pregnancy-induced] hypertension without significant proteinuria, complicating childbirth: Secondary | ICD-10-CM

## 2020-01-24 LAB — CBC
HCT: 32.5 % — ABNORMAL LOW (ref 36.0–46.0)
Hemoglobin: 9.9 g/dL — ABNORMAL LOW (ref 12.0–15.0)
MCH: 23.2 pg — ABNORMAL LOW (ref 26.0–34.0)
MCHC: 30.5 g/dL (ref 30.0–36.0)
MCV: 76.1 fL — ABNORMAL LOW (ref 80.0–100.0)
Platelets: 370 10*3/uL (ref 150–400)
RBC: 4.27 MIL/uL (ref 3.87–5.11)
RDW: 18.3 % — ABNORMAL HIGH (ref 11.5–15.5)
WBC: 24.4 10*3/uL — ABNORMAL HIGH (ref 4.0–10.5)
nRBC: 0.1 % (ref 0.0–0.2)

## 2020-01-24 MED ORDER — SODIUM CHLORIDE (PF) 0.9 % IJ SOLN
INTRAMUSCULAR | Status: DC | PRN
  Administered 2020-01-23: 12 mL/h via EPIDURAL

## 2020-01-24 MED ORDER — LIDOCAINE HCL (PF) 1 % IJ SOLN
INTRAMUSCULAR | Status: DC | PRN
  Administered 2020-01-23 (×2): 4 mL via EPIDURAL

## 2020-01-24 NOTE — Discharge Summary (Signed)
Postpartum Discharge Summary  Date of Service updated 01/26/20     Patient Name: Sierra Shannon DOB: 06-06-1995 MRN: [id number]  Date of admission: 01/22/2020 Delivery date:01/24/2020  Delivering provider: Lajean Manes  Date of discharge: 01/26/2020  Admitting diagnosis: Gestational hypertension [O13.9] Intrauterine pregnancy: [redacted]w[redacted]d    Secondary diagnosis:  Active Problems:   Supervision of other normal pregnancy, antepartum   Genetic disorder   Rh negative, maternal   GBS (group B Streptococcus carrier), +RV culture, currently pregnant   Gestational hypertension   SVD (spontaneous vaginal delivery)  Additional problems: none    Discharge diagnosis: Term Pregnancy Delivered and Gestational Hypertension                                              Post partum procedures:rhogam Augmentation: AROM, Pitocin, Cytotec and IP Foley Complications: None  Hospital course: Induction of Labor With Vaginal Delivery   24y.o. yo G3P0020 at 462w1das admitted to the hospital 01/22/2020 for induction of labor.  Indication for induction: Gestational hypertension.  Patient had an uncomplicated labor course as follows: Membrane Rupture Time/Date: 6:05 PM ,01/23/2020   Delivery Method:Vaginal, Spontaneous  Episiotomy: None  Lacerations:  None  Details of delivery can be found in separate delivery note.  Patient had a routine postpartum course. Patient is discharged home 01/26/20.  Newborn Data: Birth date:01/24/2020  Birth time:10:01 PM  Gender:Female  Living status:Living  Apgars:4 ,  Weight:2807 g   Magnesium Sulfate received: No BMZ received: No Rhophylac:Yes MMR:N/A T-DaP:Given prenatally Flu: No Transfusion:No  Physical exam  Vitals:   01/25/20 0945 01/25/20 1346 01/25/20 2120 01/26/20 0600  BP: (!) 144/92 130/85 116/72 126/86  Pulse: (!) 55 60 (!) 56 64  Resp: 17 16 16 16   Temp: (!) 97.4 F (36.3 C) 97.6 F (36.4 C) (!) 97.5 F (36.4 C) 98.1 F (36.7 C)   TempSrc: Oral Oral Oral Oral  SpO2:   100% 100%  Weight:      Height:       General: alert, cooperative and no distress Lochia: appropriate Uterine Fundus: firm Incision: N/A DVT Evaluation: No evidence of DVT seen on physical exam. Labs: Lab Results  Component Value Date   WBC 24.4 (H) 01/24/2020   HGB 9.9 (L) 01/24/2020   HCT 32.5 (L) 01/24/2020   MCV 76.1 (L) 01/24/2020   PLT 370 01/24/2020   CMP Latest Ref Rng & Units 01/22/2020  Glucose 70 - 99 mg/dL 80  BUN 6 - 20 mg/dL 6  Creatinine 0.44 - 1.00 mg/dL 0.76  Sodium 135 - 145 mmol/L 135  Potassium 3.5 - 5.1 mmol/L 3.8  Chloride 98 - 111 mmol/L 105  CO2 22 - 32 mmol/L 20(L)  Calcium 8.9 - 10.3 mg/dL 9.1  Total Protein 6.5 - 8.1 g/dL 7.1  Total Bilirubin 0.3 - 1.2 mg/dL 0.4  Alkaline Phos 38 - 126 U/L 371(H)  AST 15 - 41 U/L 25  ALT 0 - 44 U/L 22   Edinburgh Score: No flowsheet data found.   After visit meds:  Allergies as of 01/26/2020   No Known Allergies     Medication List    TAKE these medications   acetaminophen 500 MG tablet Commonly known as: TYLENOL Take 2 tablets (1,000 mg total) by mouth every 6 (six) hours as needed for headache.   coconut oil  Oil Apply 1 application topically as needed.   ferrous sulfate 325 (65 FE) MG tablet Take 1 tablet (325 mg total) by mouth every other day. Start taking on: January 27, 2020   ibuprofen 600 MG tablet Commonly known as: ADVIL Take 1 tablet (600 mg total) by mouth every 6 (six) hours.   PRENATAL VITAMINS PO Take 1 tablet by mouth.   vitamin C 250 MG tablet Commonly known as: ASCORBIC ACID Take 1 tablet (250 mg total) by mouth daily.        Discharge home in stable condition Infant Feeding: Bottle and Breast Infant Disposition:home with mother Discharge instruction: per After Visit Summary and Postpartum booklet. Activity: Advance as tolerated. Pelvic rest for 6 weeks.  Diet: routine diet Future Appointments:No future  appointments. Follow up Visit:   Please schedule this patient for a In person postpartum visit in 4 weeks with the following provider: Any provider. Additional Postpartum F/U:BP check 1 week  High risk pregnancy complicated by: HTN Delivery mode:  Vaginal, Spontaneous  Anticipated Birth Control:  Unsure-follow up at postpartum appt   99/04/4266 Arrie Senate, MD

## 2020-01-24 NOTE — Progress Notes (Signed)
Labor Progress Note Rainbow Salman is a 24 y.o. G3P0020 at [redacted]w[redacted]d presented for for IOL 2/2 gHTN S: Patient is resting comfortably and sleeping in bed with partner at bedside.   O:  BP 134/69   Pulse 72   Temp 98.6 F (37 C) (Oral)   Resp 16   Ht 5\' 2"  (1.575 m)   Wt 66.7 kg   LMP 04/02/2019 (Approximate)   SpO2 99%   BMI 26.89 kg/m  EFM: 140 HR/ Moderate variability/  + accels(Describe Fetal Tracing)  ToCo: 2-5 minutes  CVE: Dilation: 6.5 Effacement (%): 80 Cervical Position: Middle Station: 0 Presentation: Vertex Exam by:: Dr. [email or web address]    A&P: 24 y.o. 21 [redacted]w[redacted]d presented for for IOL 2/2 gHTN #Labor: Progressing well. Resting comfortably   #Pain: IV fentanyl given during this encounter. Patient can have epidural when desired  #FWB:Cat 1 #GBS negative Most recent BP at 701 was 134/69   Fredia Chittenden [redacted]w[redacted]d, Medical Student 7:50 AM

## 2020-01-24 NOTE — Anesthesia Procedure Notes (Signed)
Epidural Patient location during procedure: OB Start time: 01/23/2020 11:50 PM End time: 01/23/2020 11:57 PM  Staffing Anesthesiologist: Mal Amabile, MD Performed: anesthesiologist   Preanesthetic Checklist Completed: patient identified, IV checked, site marked, risks and benefits discussed, surgical consent, monitors and equipment checked, pre-op evaluation and timeout performed  Epidural Patient position: sitting Prep: DuraPrep and site prepped and draped Patient monitoring: continuous pulse ox and blood pressure Approach: midline Location: L3-L4 Injection technique: LOR air  Needle:  Needle type: Tuohy  Needle gauge: 17 G Needle length: 9 cm and 9 Needle insertion depth: 5 cm cm Catheter type: closed end flexible Catheter size: 19 Gauge Catheter at skin depth: 10 cm Test dose: negative and Other  Assessment Events: blood not aspirated, injection not painful, no injection resistance, no paresthesia and negative IV test  Additional Notes Patient identified. Risks and benefits discussed including failed block, incomplete  Pain control, post dural puncture headache, nerve damage, paralysis, blood pressure Changes, nausea, vomiting, reactions to medications-both toxic and allergic and post Partum back pain. All questions were answered. Patient expressed understanding and wished to proceed. Sterile technique was used throughout procedure. Epidural site was Dressed with sterile barrier dressing. No paresthesias, signs of intravascular injection Or signs of intrathecal spread were encountered.  Patient was more comfortable after the epidural was dosed. Please see RN's note for documentation of vital signs and FHR which are stable. Reason for block:procedure for pain

## 2020-01-24 NOTE — Progress Notes (Signed)
Labor Progress Note Sierra Shannon is a 24 y.o. G3P0020 at [redacted]w[redacted]d presented for IOL 2/2 gHTN.   S: Resting comfortably in bed with epidural in place. Partner and sister asleep at bedside. Pt reports pain "in my vagina with all the tubes". After readjustment, felt better. Also assisted her with the on-demand button for her epidural.   O:  BP 131/71   Pulse 85   Temp 99 F (37.2 C) (Oral)   Resp 18   Ht 5\' 2"  (1.575 m)   Wt 66.7 kg   LMP 04/02/2019 (Approximate)   SpO2 99%   BMI 26.89 kg/m  EFM:  Baseline 130 bpm / moderate variability / accels present, handful of early decels, no variables or lates. Overall reassuring and reactive strip.   CVE: Dilation: 7.5 Effacement (%): 90 Cervical Position: Middle Station: 0 Presentation: Vertex Exam by:: virginia smith, cnm   A&P: 24 y.o. 21 [redacted]w[redacted]d  presented for IOL 2/2 gHTN.  #Labor: Progressing well. Will continue pitocin.  #Pain: Epidural #FWB: Cat 1 #GBS positive: PCN #gHTN: two elevated BP overnight, last 0516. Most recent BP 131/71.   [redacted]w[redacted]d, MD 1:04 PM

## 2020-01-24 NOTE — Progress Notes (Signed)
Labor Progress Note Sierra Shannon is a 24 y.o. G3P0020 at [redacted]w[redacted]d presented for IOL 2/2 gHTN.   S: Pt was sleeping lightly when I came in, was awoken by the door squeaking. No complaints at this time. Would like to rest more.   O:  BP (!) 151/85   Pulse 76   Temp 99 F (37.2 C) (Oral)   Resp 18   Ht 5\' 2"  (1.575 m)   Wt 66.7 kg   LMP 04/02/2019 (Approximate)   SpO2 99%   BMI 26.89 kg/m  EFM:  Baseline 135 bpm / moderate variability / accels present, handful of early decels, no variables or lates. Overall reassuring and reactive strip.   CVE: Dilation: 8.5 Effacement (%): 90 Cervical Position: Middle Station: 0, Plus 1 Presentation: Vertex Exam by:: k fields, rn   A&P: 24 y.o. 21 [redacted]w[redacted]d  presented for IOL 2/2 gHTN.  #Labor: Progressing well. Will continue pitocin.  #Pain: Epidural #FWB: Cat 1 #GBS positive: PCN #gHTN: Three elevated BP since 1330, last 151/85.   [redacted]w[redacted]d, MD 2:55 PM

## 2020-01-24 NOTE — Progress Notes (Signed)
LABOR PROGRESS NOTE  Sierra Shannon is a 24 y.o. G3P0020 at [redacted]w[redacted]d  admitted for IOL for GHTN  Subjective: Patient feeling pressure in her bottom and breathing through contractions, reports epidural not really working   Objective: BP (!) 157/90   Pulse 90   Temp 98.8 F (37.1 C) (Oral)   Resp 18   Ht 5\' 2"  (1.575 m)   Wt 66.7 kg   LMP 04/02/2019 (Approximate)   SpO2 98%   BMI 26.89 kg/m  or  Vitals:   01/24/20 1901 01/24/20 1931 01/24/20 1935 01/24/20 2001  BP: (!) 152/91 (!) 149/94  (!) 157/90  Pulse: 94 87  90  Resp:      Temp: 98.8 F (37.1 C)     TempSrc: Oral     SpO2:   98%   Weight:      Height:        Patient has been laboring down, currently on 77milli-unit/min of pitocin  Dilation: 10 Dilation Complete Date: 01/24/20 Dilation Complete Time: 1843 Effacement (%): 90 Cervical Position: Middle Station: Plus 2 Presentation: Vertex Exam by:: [email or web address] CNM FHT: baseline rate 145, min- moderate varibility, +accel, no decel Toco: 3-5 minutes   Labs: Lab Results  Component Value Date   WBC 13.4 (H) 01/22/2020   HGB 10.6 (L) 01/22/2020   HCT 34.9 (L) 01/22/2020   MCV 75.7 (L) 01/22/2020   PLT 400 01/22/2020    Patient Active Problem List   Diagnosis Date Noted  . Transient hypertension 01/22/2020  . Gestational hypertension 01/22/2020  . GBS (group B Streptococcus carrier), +RV culture, currently pregnant 01/04/2020  . Rh negative, maternal 11/12/2019  . Genetic disorder 07/23/2019  . Supervision of other normal pregnancy, antepartum 06/30/2019    Assessment / Plan: 24 y.o. G3P0020 at [redacted]w[redacted]d here for IOL for GHTN   Labor: Patient will begin to push at this time  Fetal Wellbeing:  Cat II  Pain Control:  Epidural  Anticipated MOD:  SVD  [redacted]w[redacted]d, CNM 01/24/2020, 8:43 PM

## 2020-01-24 NOTE — Progress Notes (Signed)
Sierra Shannon is a 24 y.o. G3P0020 at [redacted]w[redacted]d.  Subjective: Comfortable w/ epidural.   Objective: BP 131/71   Pulse 85   Temp 99 F (37.2 C) (Oral)   Resp 18   Ht 5\' 2"  (1.575 m)   Wt 66.7 kg   LMP 04/02/2019 (Approximate)   SpO2 99%   BMI 26.89 kg/m    FHT:  FHR: 145 bpm, variability: mod,  accelerations:  15x15,  decelerations:  earlies UC:   Q 3-6 minutes, MVU's 140-170 Dilation: 7.5 Effacement (%): 90 Cervical Position: Middle Station: 0 Presentation: Vertex Exam by:: Jenafer Winterton, cnm  Labs: No results found for this or any previous visit (from the past 24 hour(s)).  Assessment / Plan: [redacted]w[redacted]d week IUP Labor: Transition Fetal Wellbeing:  Category I Pain Control:  EPidural Anticipated MOD:  SVD  [redacted]w[redacted]d, CNM 01/24/2020 10:21 AM

## 2020-01-25 MED ORDER — ONDANSETRON HCL 4 MG PO TABS: 4.0000 mg | ORAL_TABLET | ORAL | Status: DC | PRN

## 2020-01-25 MED ORDER — SIMETHICONE 80 MG PO CHEW: 80.0000 mg | CHEWABLE_TABLET | ORAL | Status: DC | PRN

## 2020-01-25 MED ORDER — ONDANSETRON HCL 4 MG/2ML IJ SOLN: 4.0000 mg | INTRAMUSCULAR | Status: DC | PRN

## 2020-01-25 MED ORDER — IBUPROFEN 600 MG PO TABS
600.0000 mg | ORAL_TABLET | Freq: Four times a day (QID) | ORAL | Status: DC
  Administered 2020-01-25 – 2020-01-26 (×5): 600 mg via ORAL
  Filled 2020-01-25 (×5): qty 1

## 2020-01-25 MED ORDER — DIPHENHYDRAMINE HCL 25 MG PO CAPS: 25.0000 mg | ORAL_CAPSULE | Freq: Four times a day (QID) | ORAL | Status: DC | PRN

## 2020-01-25 MED ORDER — COCONUT OIL OIL: 1.0000 "application " | TOPICAL_OIL | Status: DC | PRN

## 2020-01-25 MED ORDER — BENZOCAINE-MENTHOL 20-0.5 % EX AERO: 1.0000 "application " | INHALATION_SPRAY | CUTANEOUS | Status: DC | PRN

## 2020-01-25 MED ORDER — ZOLPIDEM TARTRATE 5 MG PO TABS: 5.0000 mg | ORAL_TABLET | Freq: Every evening | ORAL | Status: DC | PRN

## 2020-01-25 MED ORDER — RHO D IMMUNE GLOBULIN 1500 UNIT/2ML IJ SOSY
300.0000 ug | PREFILLED_SYRINGE | Freq: Once | INTRAMUSCULAR | Status: AC
  Administered 2020-01-25: 300 ug via INTRAVENOUS
  Filled 2020-01-25: qty 2

## 2020-01-25 MED ORDER — DIBUCAINE (PERIANAL) 1 % EX OINT: 1.0000 "application " | TOPICAL_OINTMENT | CUTANEOUS | Status: DC | PRN

## 2020-01-25 MED ORDER — WITCH HAZEL-GLYCERIN EX PADS: 1.0000 "application " | MEDICATED_PAD | CUTANEOUS | Status: DC | PRN

## 2020-01-25 MED ORDER — FERROUS SULFATE 325 (65 FE) MG PO TABS
325.0000 mg | ORAL_TABLET | ORAL | Status: DC
  Administered 2020-01-25: 325 mg via ORAL
  Filled 2020-01-25: qty 1

## 2020-01-25 MED ORDER — PRENATAL MULTIVITAMIN CH
1.0000 | ORAL_TABLET | Freq: Every day | ORAL | Status: DC
  Administered 2020-01-25: 1 via ORAL
  Filled 2020-01-25: qty 1

## 2020-01-25 MED ORDER — ACETAMINOPHEN 325 MG PO TABS
650.0000 mg | ORAL_TABLET | ORAL | Status: DC | PRN
  Administered 2020-01-25: 650 mg via ORAL
  Filled 2020-01-25: qty 2

## 2020-01-25 MED ORDER — SENNOSIDES-DOCUSATE SODIUM 8.6-50 MG PO TABS
2.0000 | ORAL_TABLET | ORAL | Status: DC
  Administered 2020-01-26: 2 via ORAL
  Filled 2020-01-25: qty 2

## 2020-01-25 MED ORDER — TETANUS-DIPHTH-ACELL PERTUSSIS 5-2.5-18.5 LF-MCG/0.5 IM SUSY: 0.5000 mL | PREFILLED_SYRINGE | Freq: Once | INTRAMUSCULAR | Status: DC

## 2020-01-25 NOTE — Lactation Note (Signed)
This note was copied from a baby's chart. Lactation Consultation Note  Patient Name: Boy Dalissa Lovin [id number] Date: 01/25/2020 Reason for consult: Mother's request;Difficult latch;Follow-up assessment;Term P1, 24 hour term female infant. Mom's feeding choice is breast and formula feeding. LC changed a stool ,while in room with parents. Per mom, she would really like to BF infant, she has been giving infant formula for all feedings and she not had any assistance with latching infant at the breast. LC informed mom she would assist her with latching infant at the breast. LC reviewed hand expression and mom easily expressed 5 mls of colostrum. Mom attempted to latch infant on her left breast in the cross cradle position but infant would not sustain latch. LC used 20 mm NS due infant only having bottles of formula in past 24 hours and pre-filled NS with 0.5 mls of EBM, infant immediately latch and sustained latch swallows could be heard and infant BF for 15 minutes, infant was given the additional 5 mls of EBM. Mom started using the DEBP any had pumped additional 5 mls and was still EBM as LC left the room, after mom finish pumping she will give infant additional EBM that she pumped before offering infant formula. Mom was very pleased that infant latched at the breast and that she has EBM to give infant. Mom knows how much to supplement infant with breastfeeding, infant is DAT+. Mom shown how to use DEBP & how to disassemble, clean, & reassemble parts. Mom made aware of O/P services, breastfeeding support groups, community resources, and our phone # for post-discharge questions.  Mom's plan: 1.Mom will  continue to BF infant according to cues using 20 mm NS,  BF infant 8 to 12+ times within 24 hours. 2. Mom will give infant back any EBM that she pumped first before supplementing infant with formula.  3. Mom will use the DEBP every 3 hours for 15 minutes on initial setting.  4. Mom knows to call  RN or LC services if she needs further assistance with latching infant at the breast.   Maternal Data    Feeding Feeding Type: Breast Fed  LATCH Score Latch: Grasps breast easily, tongue down, lips flanged, rhythmical sucking.  Audible Swallowing: Spontaneous and intermittent  Type of Nipple: Everted at rest and after stimulation  Comfort (Breast/Nipple): Soft / non-tender  Hold (Positioning): Assistance needed to correctly position infant at breast and maintain latch.  LATCH Score: 9  Interventions Interventions: Assisted with latch;Skin to skin;Support pillows;Adjust position;DEBP;Breast compression;Position options;Breast massage;Hand express;Expressed milk  Lactation Tools Discussed/Used Tools: Pump;Nipple Shields Nipple shield size: 20 (Infant not sustaining latch and been bottle fed) Breast pump type: Double-Electric Breast Pump Pump Review: Setup, frequency, and cleaning;Milk Storage Initiated by:: Danelle Earthly, IBCLC Date initiated:: 01/25/20   Consult Status Consult Status: Follow-up Date: 01/26/20 Follow-up type: In-patient    Danelle Earthly 01/25/2020, 10:34 PM

## 2020-01-25 NOTE — Anesthesia Postprocedure Evaluation (Signed)
Anesthesia Post Note  Patient: Sierra Shannon  Procedure(s) Performed: AN AD HOC LABOR EPIDURAL     Patient location during evaluation: Mother Baby Anesthesia Type: Epidural Level of consciousness: awake and alert Pain management: pain level controlled Vital Signs Assessment: post-procedure vital signs reviewed and stable Respiratory status: spontaneous breathing, nonlabored ventilation and respiratory function stable Cardiovascular status: stable Postop Assessment: no headache, no backache and epidural receding Anesthetic complications: no   No complications documented.  Last Vitals:  Vitals:   01/25/20 0100 01/25/20 0525  BP: 132/80 126/86  Pulse: 82 84  Resp: 18 18  Temp: 36.7 C 36.7 C  SpO2: 100%     Last Pain:  Vitals:   01/25/20 0525  TempSrc: Oral  PainSc: 0-No pain   Pain Goal:                   Monya Kozakiewicz

## 2020-01-25 NOTE — Progress Notes (Signed)
POSTPARTUM PROGRESS NOTE  Post Partum Day 1  Subjective:  Sierra Shannon is a 24 y.o. Q2S6015 s/p SVD at [redacted]w[redacted]d.  No acute events overnight.  Pt denies problems with ambulating, voiding or po intake.  She denies nausea or vomiting.  Pain is well controlled.  She has had flatus. She has not had bowel movement.  Lochia Moderate.   Objective: Blood pressure 126/86, pulse 84, temperature 98.1 F (36.7 C), temperature source Oral, resp. rate 18, height 5\' 2"  (1.575 m), weight 66.7 kg, last menstrual period 04/02/2019, SpO2 100 %, unknown if currently breastfeeding.  Physical Exam:  General: alert, cooperative and no distress Chest: no respiratory distress Heart:regular rate, distal pulses intact Abdomen: soft, nontender,  Uterine Fundus: firm, appropriately tender DVT Evaluation: No calf swelling or tenderness Extremities: no edema Skin: warm, dry  Recent Labs    01/22/20 1434 01/24/20 2315  HGB 10.6* 9.9*  HCT 34.9* 32.5*    Assessment/Plan: Zaidy Absher is a 24 y.o. 21 s/p SVD at [redacted]w[redacted]d   PPD#1 - Doing well Contraception: unsure at this time- discussed birth control options, continue to discuss options in detail with patient.  Feeding: breast and formula  Dispo: Plan for discharge tomorrow. Iron deficiency anemia: iron supplementation initiated on 11/8 to be taken every other day    LOS: 3 days   13/8, CNM 01/25/2020, 7:32 AM

## 2020-01-25 NOTE — Lactation Note (Signed)
This note was copied from a baby's chart. Lactation Consultation Note Baby 3 hrs old mom hasn't BF baby.  LC offered to assist in latching mom stated she was to tired. LC reminded mom of GDM and the baby's sugar can get low if he doesn't eat. Mom stated she wanted him to have some formula. Mom stated she is doing both. Asked mom if I can do some hand expression and give the baby colostrum. Mom stated not now maybe tomorrow, I'm to tired, I need rest. LC got formula and gave baby 10 ml Similac w/purple nipple. Baby looses suction of bottom jaw during feeding.  Gave mom formula and supplementing information paper and reviewed it with her. Mom preferred all information in [place].  Mom quite, polite, very sleepy, kept falling asleep. Asked FOB to hold baby upright for about 15 minutes since I just fed him. Mom interpret to FOB what I said. FOB took baby. Encouraged mom to call for assistance if needed. Important to do STS and keep up with I&O.  Lactation brochure given.  Patient Name: Sierra Shannon [id number] Date: 01/25/2020 Reason for consult: Initial assessment;Primapara;Term;Maternal endocrine disorder Type of Endocrine Disorder?: Diabetes   Maternal Data Has patient been taught Hand Expression?: No Does the patient have breastfeeding experience prior to this delivery?: No  Feeding Feeding Type: Formula Nipple Type: Nfant Slow Flow (purple)  LATCH Score                   Interventions    Lactation Tools Discussed/Used WIC Program: No   Consult Status Consult Status: Follow-up Date: 01/25/20 Follow-up type: In-patient    Jarrel Knoke, Diamond Nickel 01/25/2020, 1:20 AM

## 2020-01-26 LAB — RH IG WORKUP (INCLUDES ABO/RH)
ABO/RH(D): O NEG
Fetal Screen: NEGATIVE
Gestational Age(Wks): 40
Unit division: 0

## 2020-01-26 LAB — SURGICAL PATHOLOGY

## 2020-01-26 MED ORDER — COCONUT OIL OIL: 1.0000 "application " | TOPICAL_OIL | 0 refills | Status: AC | PRN

## 2020-01-26 MED ORDER — VITAMIN C 250 MG PO TABS: 250.0000 mg | ORAL_TABLET | Freq: Every day | ORAL | Status: AC

## 2020-01-26 MED ORDER — IBUPROFEN 600 MG PO TABS: 600.0000 mg | ORAL_TABLET | Freq: Four times a day (QID) | ORAL | 0 refills | Status: AC

## 2020-01-26 MED ORDER — FERROUS SULFATE 325 (65 FE) MG PO TABS: 325.0000 mg | ORAL_TABLET | ORAL | 0 refills | Status: AC

## 2020-01-26 NOTE — Lactation Note (Signed)
This note was copied from a baby's chart. Lactation Consultation Note  Patient Name: Boy Tagan Bartram [id number] Date: 01/26/2020    "Jacquenette Shone" is 92 hrs old. Mom says infant "latched perfectly fine" without the nipple shield at the last feeding & has been able to latch to both breasts without the nipple shield. Mom knows to contact us if for some reason she ends up using the nipple shield once at home (lactation phone # for post-discharge questions was written on the board).   Mom does not have WIC at this time. Mom used the DEBP twice last night, but not today. She denies any increase in breast heaviness, yet. Mom knows how to use the hand pump and how to clean it if she needs it for engorgement or if infant starts receiving more formula.  Mom has no complaints about latch or breastfeeding.   Mom knows to offer breast before formula. Infant is not cueing for more than 10 mL with bottle. Infant recently breastfed before we entered the room. Infant was resting peacefully/sleeping in bassinet during consult.   "Jacquenette Shone" has only lost 30 g over the last 24 hrs.  Lurline Hare [facility] 01/26/2020, 9:40 AM

## 2020-01-26 NOTE — Discharge Instructions (Signed)

## 2020-02-03 ENCOUNTER — Ambulatory Visit: Payer: Self-pay

## 2020-03-01 ENCOUNTER — Ambulatory Visit (INDEPENDENT_AMBULATORY_CARE_PROVIDER_SITE_OTHER): Payer: Medicaid Other | Admitting: Obstetrics and Gynecology

## 2020-03-01 ENCOUNTER — Encounter: Payer: Self-pay | Admitting: Obstetrics and Gynecology

## 2020-03-01 ENCOUNTER — Other Ambulatory Visit: Payer: Self-pay

## 2020-03-01 VITALS — BP 119/74 | HR 72 | Wt 123.9 lb

## 2020-03-01 DIAGNOSIS — O139 Gestational [pregnancy-induced] hypertension without significant proteinuria, unspecified trimester: Secondary | ICD-10-CM

## 2020-03-01 DIAGNOSIS — Z6791 Unspecified blood type, Rh negative: Secondary | ICD-10-CM

## 2020-03-01 NOTE — Patient Instructions (Addendum)
We recommend that you continue your prenatal vitamin daily given use of condoms for birth control.  Postpartum Care After Vaginal Delivery This sheet gives you information about how to care for yourself from the time you deliver your baby to up to 6-12 weeks after delivery (postpartum period). Your health care provider may also give you more specific instructions. If you have problems or questions, contact your health care provider. Follow these instructions at home: Vaginal bleeding  It is normal to have vaginal bleeding (lochia) after delivery. Wear a sanitary pad for vaginal bleeding and discharge. ? During the first week after delivery, the amount and appearance of lochia is often similar to a menstrual period. ? Over the next few weeks, it will gradually decrease to a dry, yellow-brown discharge. ? For most women, lochia stops completely by 4-6 weeks after delivery. Vaginal bleeding can vary from woman to woman.  Change your sanitary pads frequently. Watch for any changes in your flow, such as: ? A sudden increase in volume. ? A change in color. ? Large blood clots.  If you pass a blood clot from your vagina, save it and call your health care provider to discuss. Do not flush blood clots down the toilet before talking with your health care provider.  Do not use tampons or douches until your health care provider says this is safe.  If you are not breastfeeding, your period should return 6-8 weeks after delivery. If you are feeding your child breast milk only (exclusive breastfeeding), your period may not return until you stop breastfeeding. Perineal care  Keep the area between the vagina and the anus (perineum) clean and dry as told by your health care provider. Use medicated pads and pain-relieving sprays and creams as directed.  If you had a cut in the perineum (episiotomy) or a tear in the vagina, check the area for signs of infection until you are healed. Check for: ? More redness,  swelling, or pain. ? Fluid or blood coming from the cut or tear. ? Warmth. ? Pus or a bad smell.  You may be given a squirt bottle to use instead of wiping to clean the perineum area after you go to the bathroom. As you start healing, you may use the squirt bottle before wiping yourself. Make sure to wipe gently.  To relieve pain caused by an episiotomy, a tear in the vagina, or swollen veins in the anus (hemorrhoids), try taking a warm sitz bath 2-3 times a day. A sitz bath is a warm water bath that is taken while you are sitting down. The water should only come up to your hips and should cover your buttocks. Breast care  Within the first few days after delivery, your breasts may feel heavy, full, and uncomfortable (breast engorgement). Milk may also leak from your breasts. Your health care provider can suggest ways to help relieve the discomfort. Breast engorgement should go away within a few days.  If you are breastfeeding: ? Wear a bra that supports your breasts and fits you well. ? Keep your nipples clean and dry. Apply creams and ointments as told by your health care provider. ? You may need to use breast pads to absorb milk that leaks from your breasts. ? You may have uterine contractions every time you breastfeed for up to several weeks after delivery. Uterine contractions help your uterus return to its normal size. ? If you have any problems with breastfeeding, work with your health care provider or Advertising copywriter.  If you are not breastfeeding: ? Avoid touching your breasts a lot. Doing this can make your breasts produce more milk. ? Wear a good-fitting bra and use cold packs to help with swelling. ? Do not squeeze out (express) milk. This causes you to make more milk. Intimacy and sexuality  Ask your health care provider when you can engage in sexual activity. This may depend on: ? Your risk of infection. ? How fast you are healing. ? Your comfort and desire to engage in  sexual activity.  You are able to get pregnant after delivery, even if you have not had your period. If desired, talk with your health care provider about methods of birth control (contraception). Medicines  Take over-the-counter and prescription medicines only as told by your health care provider.  If you were prescribed an antibiotic medicine, take it as told by your health care provider. Do not stop taking the antibiotic even if you start to feel better. Activity  Gradually return to your normal activities as told by your health care provider. Ask your health care provider what activities are safe for you.  Rest as much as possible. Try to rest or take a nap while your baby is sleeping. Eating and drinking   Drink enough fluid to keep your urine pale yellow.  Eat high-fiber foods every day. These may help prevent or relieve constipation. High-fiber foods include: ? Whole grain cereals and breads. ? Brown rice. ? Beans. ? Fresh fruits and vegetables.  Do not try to lose weight quickly by cutting back on calories.  Take your prenatal vitamins until your postpartum checkup or until your health care provider tells you it is okay to stop. Lifestyle  Do not use any products that contain nicotine or tobacco, such as cigarettes and e-cigarettes. If you need help quitting, ask your health care provider.  Do not drink alcohol, especially if you are breastfeeding. General instructions  Keep all follow-up visits for you and your baby as told by your health care provider. Most women visit their health care provider for a postpartum checkup within the first 3-6 weeks after delivery. Contact a health care provider if:  You feel unable to cope with the changes that your child brings to your life, and these feelings do not go away.  You feel unusually sad or worried.  Your breasts become red, painful, or hard.  You have a fever.  You have trouble holding urine or keeping urine from  leaking.  You have little or no interest in activities you used to enjoy.  You have not breastfed at all and you have not had a menstrual period for 12 weeks after delivery.  You have stopped breastfeeding and you have not had a menstrual period for 12 weeks after you stopped breastfeeding.  You have questions about caring for yourself or your baby.  You pass a blood clot from your vagina. Get help right away if:  You have chest pain.  You have difficulty breathing.  You have sudden, severe leg pain.  You have severe pain or cramping in your lower abdomen.  You bleed from your vagina so much that you fill more than one sanitary pad in one hour. Bleeding should not be heavier than your heaviest period.  You develop a severe headache.  You faint.  You have blurred vision or spots in your vision.  You have bad-smelling vaginal discharge.  You have thoughts about hurting yourself or your baby. If you ever feel like  you may hurt yourself or others, or have thoughts about taking your own life, get help right away. You can go to the nearest emergency department or call:  Your local emergency services (911 in the [place]  A suicide crisis helpline, such as the [facility] at 951-078-1899. This is open 24 hours a day. Summary  The period of time right after you deliver your newborn up to 6-12 weeks after delivery is called the postpartum period.  Gradually return to your normal activities as told by your health care provider.  Keep all follow-up visits for you and your baby as told by your health care provider. This information is not intended to replace advice given to you by your health care provider. Make sure you discuss any questions you have with your health care provider. Document Revised: 03/08/2017 Document Reviewed: 12/17/2016 Elsevier Patient Education  2020 [employer].

## 2020-03-01 NOTE — Progress Notes (Addendum)
    Post Partum Visit Note  Sierra Shannon is a 24 y.o. [phone] female who presents for a postpartum visit. Sierra is 5 weeks 2 days postpartum following a normal spontaneous vaginal delivery.  I have fully reviewed the prenatal and intrapartum course. The delivery was at [redacted]w[redacted]d.  Anesthesia: epidural. Postpartum course has been uncomplicated. Baby is doing well. Baby is feeding by bottle Rush Barer. Bleeding no bleeding. Bowel function is normal. Bladder function is normal. Patient is not sexually active. Contraception method is condoms and natural family planning s/p counseling. Postpartum depression screening: negative.  No prior birth control. Desires another pregnancy in the future. Sierra "wants to wait until her baby gets older".   Potential methods of contraception were discussed. The patient elected to proceed with Female Condom.   Review of Systems Pertinent items noted in HPI and remainder of comprehensive ROS otherwise negative.    Objective:  LMP 04/02/2019 (Approximate)    General:  alert, cooperative and appears stated age   Breasts:  deferred  Lungs: normal WOB  Heart:  normal rate  Abdomen: soft, non-tender, non-distended   Vulva:  not evaluated  Vagina: not evaluated  Cervix:  not evaluated  Corpus: not examined  Adnexa:  not evaluated  Rectal Exam: Not performed.        Assessment:    Normal postpartum exam. No concerns today. Pap smear not indicated at today's visit. Last pap normal on 07/15/19.  Plan:   Essential components of care per ACOG recommendations:  1.  Mood and well being: Patient with negative depression screening today. Reviewed local resources for support.  - Patient does not use tobacco. - hx of drug use? No    2. Infant care and feeding:  -Patient currently breastmilk feeding? No  -Social determinants of health (SDOH) reviewed in EPIC. No concerns  3. Sexuality, contraception and birth spacing - Patient does want a pregnancy in the next year.   Desired family size is 2 children.  - Reviewed forms of contraception in tiered fashion. Patient desired natural family planning and condoms today s/p counseling.   - Discussed birth spacing of 18 months. Encouraged pt to call clinic if desire for LARC or other forms of birth control in the future.  4. Sleep and fatigue -Encouraged family/partner/community support of 4 hrs of uninterrupted sleep to help with mood and fatigue  5. Physical Recovery  - Discussed patient's delivery and complications - Patient did not have any vaginal lacerations. - Patient has urinary incontinence? No - Patient is safe to resume physical and sexual activity  6.  Health Maintenance - Last pap smear done 07/15/19 and was normal with no testing for HPV.  7. Chronic Disease: No chronic medical conditions. Blood pressure wnl today in setting of previously diagnosed gHTN. Pt was not discharged on any antihypertensives medications. No concerning signs/symptoms today. - PCP follow up prn  Sheila Oats, MD OB Fellow, [facility] 03/02/2020 3:54 PM

## 2020-04-02 ENCOUNTER — Other Ambulatory Visit: Payer: Medicaid Other

## 2020-04-02 ENCOUNTER — Other Ambulatory Visit: Payer: Self-pay

## 2020-04-02 DIAGNOSIS — Z20822 Contact with and (suspected) exposure to covid-19: Secondary | ICD-10-CM

## 2020-04-06 LAB — NOVEL CORONAVIRUS, NAA: SARS-CoV-2, NAA: DETECTED — AB

## 2020-06-16 ENCOUNTER — Other Ambulatory Visit: Payer: Self-pay

## 2020-06-16 ENCOUNTER — Emergency Department (HOSPITAL_COMMUNITY)
Admission: EM | Admit: 2020-06-16 | Discharge: 2020-06-16 | Disposition: A | Discharge status: Home / Self Care | Payer: Medicaid Other | Attending: Emergency Medicine | Admitting: Emergency Medicine

## 2020-06-16 ENCOUNTER — Encounter (HOSPITAL_COMMUNITY): Payer: Self-pay | Admitting: Emergency Medicine

## 2020-06-16 DIAGNOSIS — Z87440 Personal history of urinary (tract) infections: Secondary | ICD-10-CM | POA: Insufficient documentation

## 2020-06-16 DIAGNOSIS — R3911 Hesitancy of micturition: Secondary | ICD-10-CM | POA: Diagnosis not present

## 2020-06-16 DIAGNOSIS — N3000 Acute cystitis without hematuria: Secondary | ICD-10-CM | POA: Insufficient documentation

## 2020-06-16 DIAGNOSIS — R300 Dysuria: Secondary | ICD-10-CM | POA: Diagnosis present

## 2020-06-16 LAB — URINALYSIS, ROUTINE W REFLEX MICROSCOPIC
Bilirubin Urine: NEGATIVE
Glucose, UA: NEGATIVE mg/dL
Ketones, ur: 20 mg/dL — AB
Nitrite: POSITIVE — AB
Protein, ur: 100 mg/dL — AB
RBC / HPF: >50 RBC/hpf — ABNORMAL HIGH (ref 0–5)
Specific Gravity, Urine: 1.027 (ref 1.005–1.030)
WBC, UA: >50 WBC/hpf — ABNORMAL HIGH (ref 0–5)
pH: 5 (ref 5.0–8.0)

## 2020-06-16 LAB — I-STAT BETA HCG BLOOD, ED (MC, WL, AP ONLY): I-stat hCG, quantitative: <5 m[IU]/mL (ref <5)

## 2020-06-16 LAB — BASIC METABOLIC PANEL
Anion gap: 8 (ref 5–15)
BUN: 8 mg/dL (ref 6–20)
CO2: 25 mmol/L (ref 22–32)
Calcium: 9.4 mg/dL (ref 8.9–10.3)
Chloride: 104 mmol/L (ref 98–111)
Creatinine, Ser: 0.78 mg/dL (ref 0.44–1.00)
GFR, Estimated: >60 mL/min (ref >60)
Glucose, Bld: 86 mg/dL (ref 70–99)
Potassium: 3.8 mmol/L (ref 3.5–5.1)
Sodium: 137 mmol/L (ref 135–145)

## 2020-06-16 LAB — CBC
HCT: 41.1 % (ref 36.0–46.0)
Hemoglobin: 13.1 g/dL (ref 12.0–15.0)
MCH: 26.8 pg (ref 26.0–34.0)
MCHC: 31.9 g/dL (ref 30.0–36.0)
MCV: 84 fL (ref 80.0–100.0)
Platelets: 511 10*3/uL — ABNORMAL HIGH (ref 150–400)
RBC: 4.89 MIL/uL (ref 3.87–5.11)
RDW: 14.6 % (ref 11.5–15.5)
WBC: 12 10*3/uL — ABNORMAL HIGH (ref 4.0–10.5)
nRBC: 0 % (ref 0.0–0.2)

## 2020-06-16 MED ORDER — CEPHALEXIN 500 MG PO CAPS: 500.0000 mg | ORAL_CAPSULE | Freq: Two times a day (BID) | ORAL | 0 refills | Status: DC

## 2020-06-16 MED ORDER — CEPHALEXIN 250 MG PO CAPS
500.0000 mg | ORAL_CAPSULE | Freq: Once | ORAL | Status: AC
  Administered 2020-06-16: 500 mg via ORAL
  Filled 2020-06-16: qty 2

## 2020-06-16 NOTE — ED Provider Notes (Signed)
[facility] EMERGENCY DEPARTMENT Provider Note   CSN: [id number] Arrival date & time: 06/16/20  2022     History Chief Complaint  Patient presents with  . Urinary Tract Infection    Sierra Shannon is a 25 y.o. female.  Patient presents to the emergency department with a chief complaint of dysuria, urinary hesitancy, and frequency.  She states the symptoms started today.  She denies any fever, nausea, vomiting.  She reports that this feels similar to prior UTIs that she has had in the past.  Denies being pregnant or breast-feeding.  Denies any allergies to any symptoms.  Denies treatments prior to arrival.  The history is provided by the patient. No language interpreter was used.       Past Medical History:  Diagnosis Date  . Medical history non-contributory     Patient Active Problem List   Diagnosis Date Noted  . Postpartum care following vaginal delivery 03/01/2020  . Gestational hypertension 01/22/2020  . Rh negative, maternal 11/12/2019    Past Surgical History:  Procedure Laterality Date  . NO PAST SURGERIES       OB History    Gravida  3   Para  1   Term  1   Preterm      AB  2   Living  1     SAB  2   IAB      Ectopic      Multiple  0   Live Births  1           No family history on file.  Social History   Tobacco Use  . Smoking status: Never Smoker  . Smokeless tobacco: Never Used  Vaping Use  . Vaping Use: Former  Substance Use Topics  . Alcohol use: No  . Drug use: Never    Home Medications Prior to Admission medications   Medication Sig Start Date End Date Taking? Authorizing Provider  cephALEXin (KEFLEX) 500 MG capsule Take 1 capsule (500 mg total) by mouth 2 (two) times daily. 06/16/20  Yes Roxy Horseman, PA-C  acetaminophen (TYLENOL) 500 MG tablet Take 2 tablets (1,000 mg total) by mouth every 6 (six) hours as needed for headache. Patient not taking: Reported on 03/01/2020 09/10/19   Rasch,  Victorino Dike I, NP  coconut oil OIL Apply 1 application topically as needed. Patient not taking: Reported on 03/01/2020 01/26/20   Alric Seton, MD  ferrous sulfate 325 (65 FE) MG tablet Take 1 tablet (325 mg total) by mouth every other day. Patient not taking: Reported on 03/01/2020 01/27/20   Alric Seton, MD  ibuprofen (ADVIL) 600 MG tablet Take 1 tablet (600 mg total) by mouth every 6 (six) hours. Patient not taking: Reported on 03/01/2020 01/26/20   Alric Seton, MD  Prenatal Vit-Fe Fumarate-FA (PRENATAL VITAMINS PO) Take 1 tablet by mouth.  Patient not taking: Reported on 03/01/2020    [provider]  vitamin C (ASCORBIC ACID) 250 MG tablet Take 1 tablet (250 mg total) by mouth daily. Patient not taking: Reported on 03/01/2020 01/26/20   Alric Seton, MD    Allergies    Patient has no known allergies.  Review of Systems   Review of Systems  All other systems reviewed and are negative.   Physical Exam Updated Vital Signs BP 132/62   Pulse 85   Temp 99.1 F (37.3 C) (Oral)   Resp 16   SpO2 100%   Physical Exam Vitals  and nursing note reviewed.  Constitutional:      General: She is not in acute distress.    Appearance: She is well-developed.  HENT:     Head: Normocephalic and atraumatic.  Eyes:     Conjunctiva/sclera: Conjunctivae normal.  Cardiovascular:     Rate and Rhythm: Normal rate.     Heart sounds: No murmur heard.   Pulmonary:     Effort: Pulmonary effort is normal. No respiratory distress.  Abdominal:     General: There is no distension.     Tenderness: There is abdominal tenderness.     Comments: Mild suprapubic abdominal tenderness  Musculoskeletal:        General: Normal range of motion.     Cervical back: Neck supple.     Comments: Moves all extremities  Skin:    General: Skin is warm and dry.  Neurological:     Mental Status: She is alert and oriented to person, place, and time.  Psychiatric:        Mood and  Affect: Mood normal.        Behavior: Behavior normal.     ED Results / Procedures / Treatments   Labs (all labs ordered are listed, but only abnormal results are displayed) Labs Reviewed  URINALYSIS, ROUTINE W REFLEX MICROSCOPIC - Abnormal; Notable for the following components:      Result Value   Color, Urine AMBER (*)    APPearance CLOUDY (*)    Hgb urine dipstick LARGE (*)    Ketones, ur 20 (*)    Protein, ur 100 (*)    Nitrite POSITIVE (*)    Leukocytes,Ua MODERATE (*)    RBC / HPF >50 (*)    WBC, UA >50 (*)    Bacteria, UA MANY (*)    Non Squamous Epithelial 0-5 (*)    All other components within normal limits  CBC - Abnormal; Notable for the following components:   WBC 12.0 (*)    Platelets 511 (*)    All other components within normal limits  BASIC METABOLIC PANEL  I-STAT BETA HCG BLOOD, ED ([facility], [facility], [facility] ONLY)    EKG None  Radiology No results found.  Procedures Procedures   Medications Ordered in ED Medications  cephALEXin (KEFLEX) capsule 500 mg (has no administration in time range)    ED Course  I have reviewed the triage vital signs and the nursing notes.  Pertinent labs & imaging results that were available during my care of the patient were reviewed by me and considered in my medical decision making (see chart for details).    MDM Rules/Calculators/A&P                          Patient here with dysuria.  Urinalysis is consistent with infection.  Vital signs are stable.  She is not vomiting.  She is afebrile and nontoxic in appearance.  Given first dose of Keflex in ED.  Discharge home with the same.  Return precautions discussed.  Pregnancy test negative.  Final Clinical Impression(s) / ED Diagnoses Final diagnoses:  Acute cystitis without hematuria    Rx / DC Orders ED Discharge Orders         Ordered    cephALEXin (KEFLEX) 500 MG capsule  2 times daily        06/16/20 2335           Roxy Horseman, PA-C 06/16/20 2337     Cathren Laine,  MD 06/17/20 6644

## 2020-06-16 NOTE — ED Triage Notes (Signed)
Pt c/o lower abdominal pressure and pain with urination and urinary frequency.

## 2020-07-19 ENCOUNTER — Emergency Department (HOSPITAL_COMMUNITY)
Admission: EM | Admit: 2020-07-19 | Discharge: 2020-07-19 | Disposition: A | Discharge status: Home / Self Care | Payer: Medicaid Other | Attending: Emergency Medicine | Admitting: Emergency Medicine

## 2020-07-19 ENCOUNTER — Other Ambulatory Visit: Payer: Self-pay

## 2020-07-19 DIAGNOSIS — K0889 Other specified disorders of teeth and supporting structures: Secondary | ICD-10-CM | POA: Diagnosis not present

## 2020-07-19 MED ORDER — CLINDAMYCIN HCL 300 MG PO CAPS: 300.0000 mg | ORAL_CAPSULE | Freq: Three times a day (TID) | ORAL | 0 refills | Status: AC

## 2020-07-19 MED ORDER — CLINDAMYCIN HCL 150 MG PO CAPS
300.0000 mg | ORAL_CAPSULE | Freq: Once | ORAL | Status: AC
  Administered 2020-07-19: 300 mg via ORAL
  Filled 2020-07-19: qty 2

## 2020-07-19 NOTE — ED Provider Notes (Signed)
Emergency Medicine Provider Triage Evaluation Note  Sierra Shannon , a 25 y.o. female  was evaluated in triage.  Pt complains of 4 days of dental pain.  Review of Systems  Positive: Dental pain Negative: fevers  Physical Exam  BP 119/61 (BP Location: Right Arm)   Pulse 62   Temp 98.3 F (36.8 C) (Oral)   Resp 16   SpO2 100%  Gen:   Awake, no distress   Resp:  Normal effort  MSK:   Moves extremities without difficulty  Other:  Normal speech.   Medical Decision Making  Medically screening exam initiated at 7:24 PM.  Appropriate orders placed.  [name] was informed that the remainder of the evaluation will be completed by another provider, this initial triage assessment does not replace that evaluation, and the importance of remaining in the ED until their evaluation is complete.     Cristina Gong, PA-C 07/19/20 1925    Arby Barrette, MD 07/20/20 1549

## 2020-07-19 NOTE — ED Provider Notes (Signed)
[facility] EMERGENCY DEPARTMENT Provider Note   CSN: [id number] Arrival date & time: 07/19/20  1732     History Chief Complaint  Patient presents with  . Dental Pain    Sierra Shannon is a 25 y.o. female.  The history is provided by the patient.  Dental Pain Location:  Upper Quality:  Aching Severity:  Mild Onset quality:  Gradual Timing:  Constant Progression:  Unchanged Chronicity:  New Context: dental caries   Relieved by:  Nothing Worsened by:  Nothing Associated symptoms: facial swelling   Associated symptoms: no congestion, no drooling, no fever and no oral lesions   Risk factors: no diabetes        Past Medical History:  Diagnosis Date  . Medical history non-contributory     Patient Active Problem List   Diagnosis Date Noted  . Postpartum care following vaginal delivery 03/01/2020  . Gestational hypertension 01/22/2020  . Rh negative, maternal 11/12/2019    Past Surgical History:  Procedure Laterality Date  . NO PAST SURGERIES       OB History    Gravida  3   Para  1   Term  1   Preterm      AB  2   Living  1     SAB  2   IAB      Ectopic      Multiple  0   Live Births  1           No family history on file.  Social History   Tobacco Use  . Smoking status: Never Smoker  . Smokeless tobacco: Never Used  Vaping Use  . Vaping Use: Former  Substance Use Topics  . Alcohol use: No  . Drug use: Never    Home Medications Prior to Admission medications   Medication Sig Start Date End Date Taking? Authorizing Provider  clindamycin (CLEOCIN) 300 MG capsule Take 1 capsule (300 mg total) by mouth 3 (three) times daily for 10 days. 07/19/20 07/29/20 Yes Flossie Wexler, DO  acetaminophen (TYLENOL) 500 MG tablet Take 2 tablets (1,000 mg total) by mouth every 6 (six) hours as needed for headache. Patient not taking: Reported on 03/01/2020 09/10/19   Rasch, Victorino Dike I, NP  cephALEXin (KEFLEX) 500 MG capsule  Take 1 capsule (500 mg total) by mouth 2 (two) times daily. 06/16/20   Roxy Horseman, PA-C  coconut oil OIL Apply 1 application topically as needed. Patient not taking: Reported on 03/01/2020 01/26/20   Alric Seton, MD  ferrous sulfate 325 (65 FE) MG tablet Take 1 tablet (325 mg total) by mouth every other day. Patient not taking: Reported on 03/01/2020 01/27/20   Alric Seton, MD  ibuprofen (ADVIL) 600 MG tablet Take 1 tablet (600 mg total) by mouth every 6 (six) hours. Patient not taking: Reported on 03/01/2020 01/26/20   Alric Seton, MD  Prenatal Vit-Fe Fumarate-FA (PRENATAL VITAMINS PO) Take 1 tablet by mouth.  Patient not taking: Reported on 03/01/2020    [provider]  vitamin C (ASCORBIC ACID) 250 MG tablet Take 1 tablet (250 mg total) by mouth daily. Patient not taking: Reported on 03/01/2020 01/26/20   Alric Seton, MD    Allergies    Patient has no known allergies.  Review of Systems   Review of Systems  Constitutional: Negative for fever.  HENT: Positive for dental problem and facial swelling. Negative for congestion, drooling, ear discharge, ear pain, hearing loss, mouth  sores, nosebleeds, postnasal drip, rhinorrhea, sinus pressure, sinus pain, sneezing, sore throat, tinnitus, trouble swallowing and voice change.     Physical Exam Updated Vital Signs BP 119/61 (BP Location: Right Arm)   Pulse 62   Temp 98.3 F (36.8 C) (Oral)   Resp 16   SpO2 100%   Physical Exam Constitutional:      General: She is not in acute distress.    Appearance: She is not ill-appearing.  HENT:     Head: Normocephalic and atraumatic.     Comments: Some tenderness to palpation of the upper molar on the left upper side with some mild swelling but no obvious abscess, no trismus, no drooling, normal voice    Nose: Nose normal.     Mouth/Throat:     Mouth: Mucous membranes are moist.     Pharynx: No oropharyngeal exudate or posterior oropharyngeal  erythema.  Eyes:     Extraocular Movements: Extraocular movements intact.     Pupils: Pupils are equal, round, and reactive to light.  Musculoskeletal:     Cervical back: No rigidity.  Neurological:     Mental Status: She is alert.     ED Results / Procedures / Treatments   Labs (all labs ordered are listed, but only abnormal results are displayed) Labs Reviewed - No data to display  EKG None  Radiology No results found.  Procedures Procedures   Medications Ordered in ED Medications  clindamycin (CLEOCIN) capsule 300 mg (has no administration in time range)    ED Course  I have reviewed the triage vital signs and the nursing notes.  Pertinent labs & imaging results that were available during my care of the patient were reviewed by me and considered in my medical decision making (see chart for details).    MDM Rules/Calculators/A&P                          Sierra Shannon is here with dental pain.  Suspect dental infection the left upper molar.  Patient had last menstrual cycle few weeks ago.  Will prescribe clindamycin.  No fever.  No trismus.  Overall suspect local dental infection.  Will give information to follow-up with dentistry.  Understands return precautions.  Discharged in good condition.  This chart was dictated using voice recognition software.  Despite best efforts to proofread,  errors can occur which can change the documentation meaning.    Final Clinical Impression(s) / ED Diagnoses Final diagnoses:  Pain, dental    Rx / DC Orders ED Discharge Orders         Ordered    clindamycin (CLEOCIN) 300 MG capsule  3 times daily        07/19/20 2252           Virgina Norfolk, DO 07/19/20 2253

## 2020-07-19 NOTE — ED Triage Notes (Signed)
Pt reports left side of her face is swollen and toothache starting about 4 days ago and states called her dentist where they told her they wont be able to see her until next week

## 2020-07-19 NOTE — ED Notes (Signed)
RN reviewed discharge instructions w/ pt. Follow up and prescriptions reviewed. Pt had no further questions 

## 2021-01-12 ENCOUNTER — Emergency Department (HOSPITAL_COMMUNITY)
Admission: EM | Admit: 2021-01-12 | Discharge: 2021-01-13 | Disposition: A | Discharge status: Home / Self Care | Payer: Medicaid Other | Attending: Emergency Medicine | Admitting: Emergency Medicine

## 2021-01-12 ENCOUNTER — Encounter (HOSPITAL_COMMUNITY): Payer: Self-pay | Admitting: Emergency Medicine

## 2021-01-12 ENCOUNTER — Other Ambulatory Visit: Payer: Self-pay

## 2021-01-12 DIAGNOSIS — N390 Urinary tract infection, site not specified: Secondary | ICD-10-CM

## 2021-01-12 DIAGNOSIS — M25561 Pain in right knee: Secondary | ICD-10-CM | POA: Insufficient documentation

## 2021-01-12 DIAGNOSIS — R29898 Other symptoms and signs involving the musculoskeletal system: Secondary | ICD-10-CM

## 2021-01-12 DIAGNOSIS — Z79899 Other long term (current) drug therapy: Secondary | ICD-10-CM | POA: Insufficient documentation

## 2021-01-12 DIAGNOSIS — R531 Weakness: Secondary | ICD-10-CM | POA: Diagnosis present

## 2021-01-12 NOTE — ED Provider Notes (Signed)
Emergency Medicine Provider Triage Evaluation Note  Sierra Shannon , a 25 y.o. female  was evaluated in triage.  Pt complains of weakness to her right foot.  Began earlier today.  No known trauma or injury.  No swelling, redness, warmth.  Feels like she cannot place weight on it due to the weakness.  No back pain.  Review of Systems  Positive: Right foot weakness Negative:   Physical Exam  BP 134/85 (BP Location: Left Arm)   Pulse 86   Temp 98.6 F (37 C) (Oral)   Resp 16   SpO2 100%  Gen:   Awake, no distress   Resp:  Normal effort  MSK:   Moves extremities without difficulty, able to plantarflex, dorsiflex without difficulty Neuro:  Intact sensation Other:    Medical Decision Making  Medically screening exam initiated at 9:40 PM.  Appropriate orders placed.  Sierra Shannon was informed that the remainder of the evaluation will be completed by another provider, this initial triage assessment does not replace that evaluation, and the importance of remaining in the ED until their evaluation is complete.  No deficits on exam    Right foot weakness   Sierra Shannon A, PA-C 01/12/21 2140    Sierra Bucco, MD 01/12/21 2332

## 2021-01-12 NOTE — ED Triage Notes (Signed)
Pt c/o right leg weakness and numbness that started this afternoon. States her leg feels like it is asleep. Denies pain, no injury.

## 2021-01-13 ENCOUNTER — Emergency Department (HOSPITAL_COMMUNITY): Payer: Medicaid Other

## 2021-01-13 DIAGNOSIS — R29898 Other symptoms and signs involving the musculoskeletal system: Secondary | ICD-10-CM

## 2021-01-13 DIAGNOSIS — R531 Weakness: Secondary | ICD-10-CM

## 2021-01-13 LAB — COMPREHENSIVE METABOLIC PANEL
ALT: 13 U/L (ref 0–44)
AST: 16 U/L (ref 15–41)
Albumin: 4 g/dL (ref 3.5–5.0)
Alkaline Phosphatase: 110 U/L (ref 38–126)
Anion gap: 7 (ref 5–15)
BUN: 13 mg/dL (ref 6–20)
CO2: 20 mmol/L — ABNORMAL LOW (ref 22–32)
Calcium: 9.1 mg/dL (ref 8.9–10.3)
Chloride: 108 mmol/L (ref 98–111)
Creatinine, Ser: 0.67 mg/dL (ref 0.44–1.00)
GFR, Estimated: >60 mL/min (ref >60)
Glucose, Bld: 87 mg/dL (ref 70–99)
Potassium: 3.9 mmol/L (ref 3.5–5.1)
Sodium: 135 mmol/L (ref 135–145)
Total Bilirubin: 1.3 mg/dL — ABNORMAL HIGH (ref 0.3–1.2)
Total Protein: 7.3 g/dL (ref 6.5–8.1)

## 2021-01-13 LAB — CBC WITH DIFFERENTIAL/PLATELET
Abs Immature Granulocytes: 0.02 10*3/uL (ref 0.00–0.07)
Basophils Absolute: 0.1 10*3/uL (ref 0.0–0.1)
Basophils Relative: 2 %
Eosinophils Absolute: 0.1 10*3/uL (ref 0.0–0.5)
Eosinophils Relative: 1 %
HCT: 40.8 % (ref 36.0–46.0)
Hemoglobin: 13.2 g/dL (ref 12.0–15.0)
Immature Granulocytes: 0 %
Lymphocytes Relative: 31 %
Lymphs Abs: 2.5 10*3/uL (ref 0.7–4.0)
MCH: 26.2 pg (ref 26.0–34.0)
MCHC: 32.4 g/dL (ref 30.0–36.0)
MCV: 81 fL (ref 80.0–100.0)
Monocytes Absolute: 0.5 10*3/uL (ref 0.1–1.0)
Monocytes Relative: 6 %
Neutro Abs: 5 10*3/uL (ref 1.7–7.7)
Neutrophils Relative %: 60 %
Platelets: 461 10*3/uL — ABNORMAL HIGH (ref 150–400)
RBC: 5.04 MIL/uL (ref 3.87–5.11)
RDW: 15.5 % (ref 11.5–15.5)
WBC: 8.2 10*3/uL (ref 4.0–10.5)
nRBC: 0 % (ref 0.0–0.2)

## 2021-01-13 LAB — URINALYSIS, ROUTINE W REFLEX MICROSCOPIC
Bilirubin Urine: NEGATIVE
Glucose, UA: NEGATIVE mg/dL
Hgb urine dipstick: NEGATIVE
Ketones, ur: 5 mg/dL — AB
Nitrite: POSITIVE — AB
Protein, ur: NEGATIVE mg/dL
Specific Gravity, Urine: 1.027 (ref 1.005–1.030)
pH: 5 (ref 5.0–8.0)

## 2021-01-13 LAB — PREGNANCY, URINE: Preg Test, Ur: NEGATIVE

## 2021-01-13 MED ORDER — CEPHALEXIN 500 MG PO CAPS: 500.0000 mg | ORAL_CAPSULE | Freq: Two times a day (BID) | ORAL | 0 refills | Status: AC

## 2021-01-13 MED ORDER — GADOBUTROL 1 MMOL/ML IV SOLN
5.0000 mL | Freq: Once | INTRAVENOUS | Status: AC | PRN
  Administered 2021-01-13: 5 mL via INTRAVENOUS

## 2021-01-13 NOTE — ED Notes (Signed)
Patient transported to MRI 

## 2021-01-13 NOTE — Discharge Instructions (Signed)
Take the antibiotics as prescribed. Follow-up with the neurologist listed below. Return to the ER if you start to experience worsening symptoms, increased numbness or weakness, blurry vision or changes to her speech.

## 2021-01-13 NOTE — ED Provider Notes (Signed)
[facility] EMERGENCY DEPARTMENT Provider Note   CSN: [id number] Arrival date & time: 01/12/21  2125     History Chief Complaint  Patient presents with   Extremity Weakness    Jannine Abreu is a 25 y.o. female.  Pt reports she had pain in her right knee yesterday.  Pt reports today her foot is weak.  Pt reports difficulty walking.  Pt can not walk.  Pt reports foot in numb.    The history is provided by the patient. No language interpreter was used.  Extremity Weakness This is a new problem. The problem occurs constantly. The problem has not changed since onset.Nothing aggravates the symptoms. Nothing relieves the symptoms. She has tried nothing for the symptoms. The treatment provided no relief.      Past Medical History:  Diagnosis Date   Medical history non-contributory     Patient Active Problem List   Diagnosis Date Noted   Postpartum care following vaginal delivery 03/01/2020   Gestational hypertension 01/22/2020   Rh negative, maternal 11/12/2019    Past Surgical History:  Procedure Laterality Date   NO PAST SURGERIES       OB History     Gravida  3   Para  1   Term  1   Preterm      AB  2   Living  1      SAB  2   IAB      Ectopic      Multiple  0   Live Births  1           No family history on file.  Social History   Tobacco Use   Smoking status: Never   Smokeless tobacco: Never  Vaping Use   Vaping Use: Former  Substance Use Topics   Alcohol use: No   Drug use: Never    Home Medications Prior to Admission medications   Medication Sig Start Date End Date Taking? Authorizing Provider  acetaminophen (TYLENOL) 500 MG tablet Take 2 tablets (1,000 mg total) by mouth every 6 (six) hours as needed for headache. Patient not taking: No sig reported 09/10/19   Rasch, Victorino Dike I, NP  cephALEXin (KEFLEX) 500 MG capsule Take 1 capsule (500 mg total) by mouth 2 (two) times daily. Patient not taking: Reported  on 01/13/2021 06/16/20   Roxy Horseman, PA-C  coconut oil OIL Apply 1 application topically as needed. Patient not taking: No sig reported 01/26/20   Alric Seton, MD  ferrous sulfate 325 (65 FE) MG tablet Take 1 tablet (325 mg total) by mouth every other day. Patient not taking: No sig reported 01/27/20   Alric Seton, MD  ibuprofen (ADVIL) 600 MG tablet Take 1 tablet (600 mg total) by mouth every 6 (six) hours. Patient not taking: No sig reported 01/26/20   Alric Seton, MD  vitamin C (ASCORBIC ACID) 250 MG tablet Take 1 tablet (250 mg total) by mouth daily. Patient not taking: No sig reported 01/26/20   Alric Seton, MD    Allergies    Patient has no known allergies.  Review of Systems   Review of Systems  Musculoskeletal:  Positive for extremity weakness.  All other systems reviewed and are negative.  Physical Exam Updated Vital Signs BP 96/64   Pulse (!) 54   Temp 98.1 F (36.7 C) (Oral)   Resp 20   SpO2 100%   Physical Exam Vitals and nursing note reviewed.  Constitutional:  Appearance: She is well-developed.  HENT:     Head: Normocephalic.     Mouth/Throat:     Mouth: Mucous membranes are moist.  Eyes:     Extraocular Movements: Extraocular movements intact.     Pupils: Pupils are equal, round, and reactive to light.  Cardiovascular:     Rate and Rhythm: Normal rate.  Pulmonary:     Effort: Pulmonary effort is normal.  Abdominal:     General: Abdomen is flat. There is no distension.  Musculoskeletal:        General: Normal range of motion.     Cervical back: Normal range of motion.  Skin:    General: Skin is warm.  Neurological:     Mental Status: She is alert and oriented to person, place, and time.     Comments: Difficulty walking.  Pt moves foot with difficulty, decreased strength right foot and lower leg.  Nv and sn intact  Psychiatric:        Mood and Affect: Mood normal.    ED Results / Procedures / Treatments    Labs (all labs ordered are listed, but only abnormal results are displayed) Labs Reviewed  CBC WITH DIFFERENTIAL/PLATELET - Abnormal; Notable for the following components:      Result Value   Platelets 461 (*)    All other components within normal limits  COMPREHENSIVE METABOLIC PANEL - Abnormal; Notable for the following components:   CO2 20 (*)    Total Bilirubin 1.3 (*)    All other components within normal limits  URINALYSIS, ROUTINE W REFLEX MICROSCOPIC - Abnormal; Notable for the following components:   APPearance HAZY (*)    Ketones, ur 5 (*)    Nitrite POSITIVE (*)    Leukocytes,Ua SMALL (*)    Bacteria, UA MANY (*)    All other components within normal limits  PREGNANCY, URINE    EKG None  Radiology No results found.  Procedures Procedures   Medications Ordered in ED Medications - No data to display  ED Course  I have reviewed the triage vital signs and the nursing notes.  Pertinent labs & imaging results that were available during my care of the patient were reviewed by me and considered in my medical decision making (see chart for details).    MDM Rules/Calculators/A&P                           MDM:  Dr. Marchelle Folks Neurology in to see and evaluate.  He advised mri and LS spine mri Pt's care turned over to Hina PA at 3:00pm    Final Clinical Impression(s) / ED Diagnoses Final diagnoses:  Weakness  Weakness of right foot    Rx / DC Orders ED Discharge Orders     None        Elson Areas, [place] 01/13/21 1514    Tegeler, Canary Brim, MD 01/13/21 1537

## 2021-01-13 NOTE — Consult Note (Signed)
NEUROLOGY CONSULTATION NOTE   Date of service: January 13, 2021 Patient Name: Sierra Shannon MRN:  [id number] DOB:  [birth year] Reason for consult: "R Leg weakness and numbness" Requesting Provider: Tegeler, Canary Brim, * _ _ _   _ __   _ __ _ _  __ __   _ __   __ _  History of Present Illness  Sierra Shannon is a 25 y.o. female with no significant PMH who presents with mild RLE weakness and numbness that started in the afternoon today. She reports some pain in the Right knee yesterday. Reports that she is moving up and downstairs all the time with her job and will kneel few times during her work. She reports feeling her R foot is numb and the back of her R leg hurts a little.  No prior hx of MS, no history of stroke.  No significant or extended amount of pressure over the neck of the fibula or lateral aspect of her right knee.  She has no problems with her vision or with her balance.  She does not smoke, she does not drink alcohol, she does not use any recreational substances.  She denies any trauma.  No fever, no chills, no tenderness to palpation of her knee. Did not sit with her legs crossed for an extended amount of time.   ROS   Constitutional Denies weight loss, fever and chills.   HEENT Denies changes in vision and hearing.   Respiratory Denies SOB and cough.   CV Denies palpitations and CP   GI Denies abdominal pain, nausea, vomiting and diarrhea.   GU Denies dysuria and urinary frequency.   MSK Denies myalgia and joint pain.   Skin Denies rash and pruritus.   Neurological Denies headache and syncope.   Psychiatric Denies recent changes in mood. Denies anxiety and depression.    Past History   Past Medical History:  Diagnosis Date   Medical history non-contributory    Past Surgical History:  Procedure Laterality Date   NO PAST SURGERIES     No family history on file. Social History   Socioeconomic History   Marital status: Single    Spouse name: Not on file    Number of children: Not on file   Years of education: Not on file   Highest education level: 10th grade  Occupational History   Occupation: unemployed  Tobacco Use   Smoking status: Never   Smokeless tobacco: Never  Vaping Use   Vaping Use: Former  Substance and Sexual Activity   Alcohol use: No   Drug use: Never   Sexual activity: Yes    Birth control/protection: None  Other Topics Concern   Not on file  Social History Narrative   Not on file   Social Determinants of Health   Financial Resource Strain: Not on file  Food Insecurity: Not on file  Transportation Needs: Not on file  Physical Activity: Not on file  Stress: Not on file  Social Connections: Not on file   No Known Allergies  Medications  (Not in a hospital admission)    Vitals   Vitals:   01/13/21 0835 01/13/21 1141 01/13/21 1305 01/13/21 1430  BP: 112/67 101/74 108/65 96/64  Pulse: (!) 56 (!) 59 (!) 56 (!) 54  Resp: 17 20 16 20   Temp: 98.1 F (36.7 C)     TempSrc: Oral     SpO2: 100% 100% 100% 100%     There is no height or  weight on file to calculate BMI.  Physical Exam   General: Laying comfortably in bed; in no acute distress.  HENT: Normal oropharynx and mucosa. Normal external appearance of ears and nose.  Neck: Supple, no pain or tenderness  CV: No JVD. No peripheral edema.  Pulmonary: Symmetric Chest rise. Normal respiratory effort.  Abdomen: Soft to touch, non-tender.  Ext: No cyanosis, edema, or deformity  Skin: No rash. Normal palpation of skin.   Musculoskeletal: Normal digits and nails by inspection. No clubbing.   Neurologic Examination  Mental status/Cognition: Alert, oriented to self, place, month and year, good attention.  Speech/language: Fluent, comprehension intact, object naming intact, repetition intact.  Cranial nerves:   CN II Pupils equal and reactive to light, no VF deficits    CN III,IV,VI EOM intact, no gaze preference or deviation, no nystagmus    CN V  normal sensation in V1, V2, and V3 segments bilaterally    CN VII no asymmetry, no nasolabial fold flattening    CN VIII normal hearing to speech    CN IX & X normal palatal elevation, no uvular deviation    CN XI 5/5 head turn and 5/5 shoulder shrug bilaterally    CN XII midline tongue protrusion    Motor:  Muscle bulk: normal, tone normal, pronator drift none tremor none Mvmt Root Nerve  Muscle Right Left Comments  SA C5/6 Ax Deltoid 5 5   EF C5/6 Mc Biceps 5 5   EE C6/7/8 Rad Triceps 5 5   WF C6/7 Med FCR     WE C7/8 PIN ECU     F Ab C8/T1 U ADM/FDI 5 5   HF L1/2/3 Fem Illopsoas 5 5   KE L2/3/4 Fem Quad 5 5   DF L4/5 D Peron Tib Ant 5 5   PF S1/2 Tibial Grc/Sol 5 5    Reflexes:  Right Left Comments  Pectoralis      Biceps (C5/6) 2 2   Brachioradialis (C5/6) 2 2    Triceps (C6/7) 2 2    Patellar (L3/4) 2 2    Achilles (S1)      Hoffman      Plantar mute mute   Jaw jerk    Sensation:  Light touch Intact throughout   Pin prick numb in R foot and R leg, not in dermatomal distribution.   Temperature intact   Vibration   Proprioception    Coordination/Complex Motor:  - Finger to Nose intact BL - Heel to shin intact BL - Rapid alternating movement are normal - Gait: Hesitant, Stride length short. Arm swing poor. Base width narrow. Unable to toe walk. Can heel walk and tandem walk.  Labs   CBC:  Recent Labs  Lab 01/13/21 1027  WBC 8.2  NEUTROABS 5.0  HGB 13.2  HCT 40.8  MCV 81.0  PLT 461*    Basic Metabolic Panel:  Lab Results  Component Value Date   NA 135 01/13/2021   K 3.9 01/13/2021   CO2 20 (L) 01/13/2021   GLUCOSE 87 01/13/2021   BUN 13 01/13/2021   CREATININE 0.67 01/13/2021   CALCIUM 9.1 01/13/2021   GFRNONAA >60 01/13/2021   GFRAA >60 03/06/2016   Lipid Panel: No results found for: LDLCALC HgbA1c: No results found for: HGBA1C Urine Drug Screen: No results found for: LABOPIA, COCAINSCRNUR, LABBENZ, AMPHETMU, THCU, LABBARB  Alcohol Level  No results found for: [facility]  MRI Brain and MRI L-spine with and without contrast: Pending  Impression  Sierra Shannon is a 25 y.o. female with no significant past medical history who presents with mild weakness in her right leg that is only notable when she is toe walking and numbness to pinprick sensation below the right knee that started acutely a couple hours ago.  She does not have any obvious cord symptoms.  The distribution does not appear dermatomal.  She does not have any obvious risk factors for stroke.  Does not endorse any prior issues with vision or balance or any other episodes of unilateral or focal deficit.  The distribution of her numbness also does not seem to be consistent with what I would expect for a peroneal nerve palsy.  Recommendations  - We will get an MRI brain with and without contrast and MRI lumbar spine with and without contrast for further evaluation.  If these are negative, I would recommend folllow up with Dr. Nita Sickle with [facility] for outpatient EMG with nerve conduction studies for further evaluation. -Would recommend doing outpatient PT and OT. ______________________________________________________________________  Plan discussed with Dr. Lynden Oxford with the emergency department.  Thank you for the opportunity to take part in the care of this patient. If you have any further questions, please contact the neurology consultation attending.  Signed,  Erick Blinks [facility] Pager Number [phone] _ _ _   _ __   _ __ _ _  __ __   _ __   __ _

## 2021-01-13 NOTE — ED Provider Notes (Signed)
Physical Exam  BP 105/77   Pulse (!) 58   Temp 98.1 F (36.7 C) (Oral)   Resp 20   SpO2 98%   Physical Exam Vitals and nursing note reviewed.  Constitutional:      General: She is not in acute distress.    Appearance: She is well-developed. She is not diaphoretic.  HENT:     Head: Normocephalic and atraumatic.  Eyes:     General: No scleral icterus.    Conjunctiva/sclera: Conjunctivae normal.  Pulmonary:     Effort: Pulmonary effort is normal. No respiratory distress.  Musculoskeletal:     Cervical back: Normal range of motion.  Skin:    Findings: No rash.  Neurological:     Mental Status: She is alert.    ED Course/Procedures   Clinical Course as of 01/13/21 1750  Fri Jan 13, 2021  1750 Nitrite(!): POSITIVE [HK]  1750 Glori Luis): SMALL [HK]  1750 Bacteria, UA(!): MANY [HK]    Clinical Course User Index [HK] Dietrich Pates, PA-C    Procedures  MDM    Care of patient assumed from PA [place] at 3:30 PM.  Agree with history, physical exam and plan.  See their note for further details.  Briefly, 25 y.o. female with PMH/PSH as below who presents with right knee pain since yesterday.  Reports difficulty ambulating and numbness in foot since today. Evaluated by neurology at the bedside.  Past Medical History:  Diagnosis Date   Medical history non-contributory    Past Surgical History:  Procedure Laterality Date   NO PAST SURGERIES        Current Plan: Evaluated by neurology at the bedside who recommends MRI of the brain, lumbar spine   MDM/ED Course: MRI of the lumbar spine, brain without any abnormalities.  Neurology recommended follow-up outpatient with neurology for nerve conduction studies as well as PT and OT outpatient.   Coincidentally patient found to have urine positive for UTI.  She does state that she is symptomatic.  We will treat with antibiotics.   Consults: Neurology Derry Lory   Significant labs/images: MR Brain W and Wo  Contrast  Result Date: 01/13/2021 CLINICAL DATA:  Neuro deficit, acute, stroke suspected; right lower extremity weakness EXAM: MRI HEAD WITHOUT AND WITH CONTRAST TECHNIQUE: Multiplanar, multiecho pulse sequences of the brain and surrounding structures were obtained without and with intravenous contrast. CONTRAST:  10mL GADAVIST GADOBUTROL 1 MMOL/ML IV SOLN COMPARISON:  None. FINDINGS: Brain: There is no acute infarction or intracranial hemorrhage. There is no intracranial mass, mass effect, or edema. There is no hydrocephalus or extra-axial fluid collection. Ventricles and sulci are normal in size and configuration. No abnormal enhancement. Vascular: Major vessel flow voids at the skull base are preserved. Skull and upper cervical spine: Normal marrow signal is preserved. Sinuses/Orbits: Paranasal sinuses are aerated. Orbits are unremarkable. Other: Sella is unremarkable.  Mastoid air cells are clear. IMPRESSION: No acute infarction, hemorrhage, or mass.  No abnormal enhancement. Electronically Signed   By: Guadlupe Spanish M.D.   On: 01/13/2021 16:44   MR Lumbar Spine W Wo Contrast  Result Date: 01/13/2021 CLINICAL DATA:  Right lower extremity weakness and numbness which began today. EXAM: MRI LUMBAR SPINE WITHOUT AND WITH CONTRAST TECHNIQUE: Multiplanar and multiecho pulse sequences of the lumbar spine were obtained without and with intravenous contrast. CONTRAST:  33mL GADAVIST GADOBUTROL 1 MMOL/ML IV SOLN COMPARISON:  None. FINDINGS: Segmentation: There are five lumbar type vertebral bodies. The last full intervertebral disc space is labeled L5-S1.  Alignment:  Normal Vertebrae:  Normal marrow signal.  No bone lesions or fractures. Conus medullaris and cauda equina: Conus extends to the T12-L1 level. Conus and cauda equina appear normal. Paraspinal and other soft tissues: No significant paraspinal or retroperitoneal findings. Disc levels: No lumbar disc protrusions, spinal or foraminal stenosis. There is mild  disc desiccation noted at L4-5 and a slight bulging annulus but no focal disc protrusion, spinal foraminal stenosis. IMPRESSION: Unremarkable lumbar spine MRI examination. No lumbar disc protrusions, spinal or foraminal stenosis. Electronically Signed   By: Rudie Meyer M.D.   On: 01/13/2021 16:40    I personally reviewed and interpreted all labs.   Patient is hemodynamically stable, in NAD. Evaluation does not show pathology that would require ongoing emergent intervention or inpatient treatment. I explained the diagnosis to the patient. Pain has been managed and has no complaints prior to discharge. Patient is comfortable with above plan and is stable for discharge at this time. All questions were answered prior to disposition. Strict return precautions for returning to the ED were discussed. Encouraged follow up with PCP.   An After Visit Summary was printed and given to the patient.   Portions of this note were generated with Scientist, clinical (histocompatibility and immunogenetics). Dictation errors may occur despite best attempts at proofreading.       Dietrich Pates, PA-C 01/13/21 1751    Benjiman Core, MD 01/14/21 1438

## 2021-01-16 ENCOUNTER — Encounter: Payer: Self-pay | Admitting: Neurology

## 2021-02-28 NOTE — Progress Notes (Deleted)
[facility]rology [facility] Clinic Note - Initial Visit   Date: 02/28/21  Sierra Shannon MRN: [id number] DOB: 1995-05-07   Dear Dr Marland KitchenPatient, No Pcp Per (Inactive):  Thank you for your kind referral of Sierra Shannon for consultation of ***. Although her history is well known to you, please allow [place] to reiterate it for the purpose of our medical record. The patient was accompanied to the clinic by *** who also provides collateral information.     History of Present Illness: Sierra Shannon is a 25 y.o. ***-handed female with *** presenting for evaluation of ***.   Right leg weakness and numbness.  Out-side paper records, electronic medical record, and images have been reviewed where available and summarized as: *** MRI brain wo contrast 01/13/2021: No acute infarction, hemorrhage, or mass.  No abnormal enhancement.  MRI lumbar spine wo contrast 01/13/2021: Unremarkable lumbar spine MRI examination. No lumbar disc protrusions, spinal or foraminal stenosis.    No results found for: HGBA1C No results found for: VITAMINB12 No results found for: TSH No results found for: ESRSEDRATE, POCTSEDRATE  Past Medical History:  Diagnosis Date   Medical history non-contributory     Past Surgical History:  Procedure Laterality Date   NO PAST SURGERIES       Medications:  Outpatient Encounter Medications as of 03/01/2021  Medication Sig   acetaminophen (TYLENOL) 500 MG tablet Take 2 tablets (1,000 mg total) by mouth every 6 (six) hours as needed for headache. (Patient not taking: No sig reported)   coconut oil OIL Apply 1 application topically as needed. (Patient not taking: No sig reported)   ferrous sulfate 325 (65 FE) MG tablet Take 1 tablet (325 mg total) by mouth every other day. (Patient not taking: No sig reported)   ibuprofen (ADVIL) 600 MG tablet Take 1 tablet (600 mg total) by mouth every 6 (six) hours. (Patient not taking: No sig reported)   vitamin  C (ASCORBIC ACID) 250 MG tablet Take 1 tablet (250 mg total) by mouth daily. (Patient not taking: No sig reported)   No facility-administered encounter medications on file as of 03/01/2021.    Allergies: No Known Allergies  Family History: No family history on file.  Social History: Social History   Tobacco Use   Smoking status: Never   Smokeless tobacco: Never  Vaping Use   Vaping Use: Former  Substance Use Topics   Alcohol use: No   Drug use: Never   Social History   Social History Narrative   Not on file    Vital Signs:  There were no vitals taken for this visit.   General Medical Exam:  *** General:  Well appearing, comfortable.   Eyes/ENT: see cranial nerve examination.   Neck:   No carotid bruits. Respiratory:  Clear to auscultation, good air entry bilaterally.   Cardiac:  Regular rate and rhythm, no murmur.   Extremities:  No deformities, edema, or skin discoloration.  Skin:  No rashes or lesions.  Neurological Exam: MENTAL STATUS including orientation to time, place, person, recent and remote memory, attention span and concentration, language, and fund of knowledge is ***normal.  Speech is not dysarthric.  CRANIAL NERVES: II:  No visual field defects.  Unremarkable fundi.   III-IV-VI: Pupils equal round and reactive to light.  Normal conjugate, extra-ocular eye movements in all directions of gaze.  No nystagmus.  No ptosis***.   V:  Normal facial sensation.    VII:  Normal facial symmetry and movements.  VIII:  Normal hearing and vestibular function.   IX-X:  Normal palatal movement.   XI:  Normal shoulder shrug and head rotation.   XII:  Normal tongue strength and range of motion, no deviation or fasciculation.  MOTOR:  No atrophy, fasciculations or abnormal movements.  No pronator drift.   Upper Extremity:  Right  Left  Deltoid  5/5   5/5   Biceps  5/5   5/5   Triceps  5/5   5/5   Infraspinatus 5/5  5/5  Medial pectoralis 5/5  5/5  Wrist  extensors  5/5   5/5   Wrist flexors  5/5   5/5   Finger extensors  5/5   5/5   Finger flexors  5/5   5/5   Dorsal interossei  5/5   5/5   Abductor pollicis  5/5   5/5   Tone (Ashworth scale)  0  0   Lower Extremity:  Right  Left  Hip flexors  5/5   5/5   Hip extensors  5/5   5/5   Adductor 5/5  5/5  Abductor 5/5  5/5  Knee flexors  5/5   5/5   Knee extensors  5/5   5/5   Dorsiflexors  5/5   5/5   Plantarflexors  5/5   5/5   Toe extensors  5/5   5/5   Toe flexors  5/5   5/5   Tone (Ashworth scale)  0  0   MSRs:  Right        Left                  brachioradialis 2+  2+  biceps 2+  2+  triceps 2+  2+  patellar 2+  2+  ankle jerk 2+  2+  Hoffman no  no  plantar response down  down   SENSORY:  Normal and symmetric perception of light touch, pinprick, vibration, and proprioception.  Romberg's sign absent.   COORDINATION/GAIT: Normal finger-to- nose-finger and heel-to-shin.  Intact rapid alternating movements bilaterally.  Able to rise from a chair without using arms.  Gait narrow based and stable. Tandem and stressed gait intact.    IMPRESSION: ***  PLAN/RECOMMENDATIONS:  *** Return to clinic in *** months.  Total time spent: ***   Thank you for allowing me to participate in patient's care.  If I can answer any additional questions, I would be pleased to do so.    Sincerely,    Amere Iott K. Allena Katz, DO

## 2021-03-01 ENCOUNTER — Ambulatory Visit: Payer: Medicaid Other | Admitting: Neurology

## 2021-09-08 IMAGING — US US OB < 14 WEEKS - US OB TV
1 series · 15 of 28 positions shown · non-contrast
Comparison: September 30, 2018.

CLINICAL DATA: A 23-year-old female with last menstrual period
April 02, 2019, abdominal pain, and positive urine pregnancy test.

EXAM:
OBSTETRIC <14 WK US AND TRANSVAGINAL OB US
TECHNIQUE: Both transabdominal and transvaginal ultrasound examinations were
performed for complete evaluation of the gestation as well as the
maternal uterus, adnexal regions, and pelvic cul-de-sac.
Transvaginal technique was performed to assess early pregnancy.

[Series 1: us ob < 14 weeks - us ob tv · 15 of 48 slices shown]
[im 1/48]
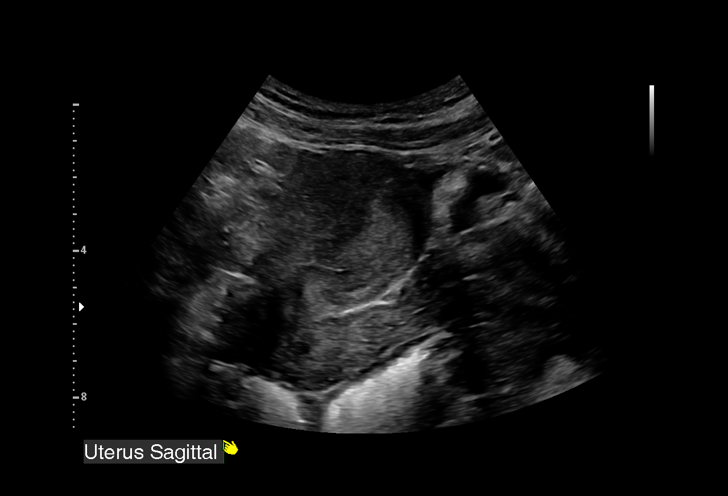
[im 4/48]
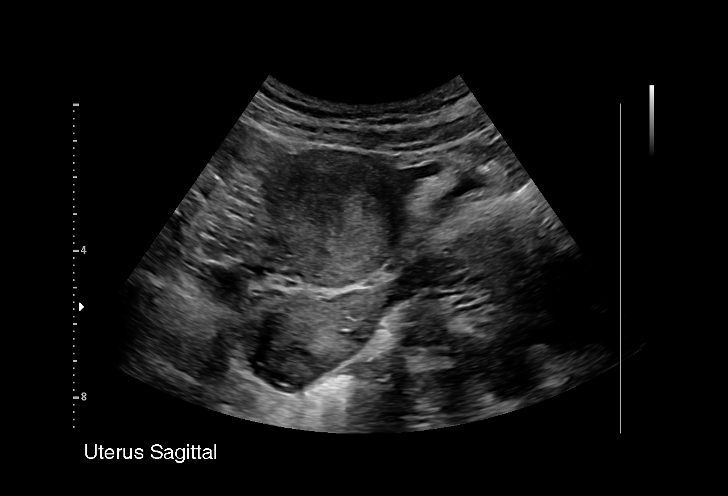
[im 7/48]
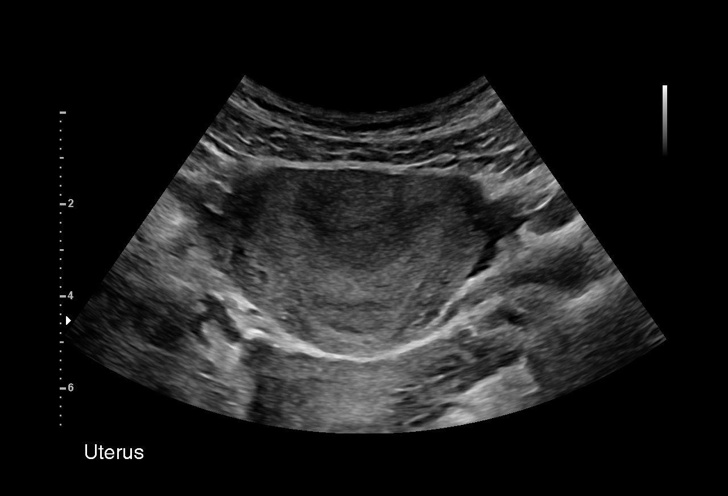
[im 11/48]
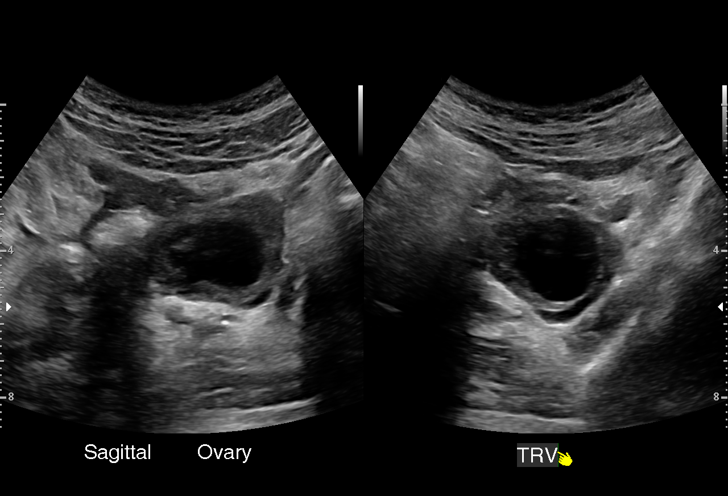
[im 14/48]
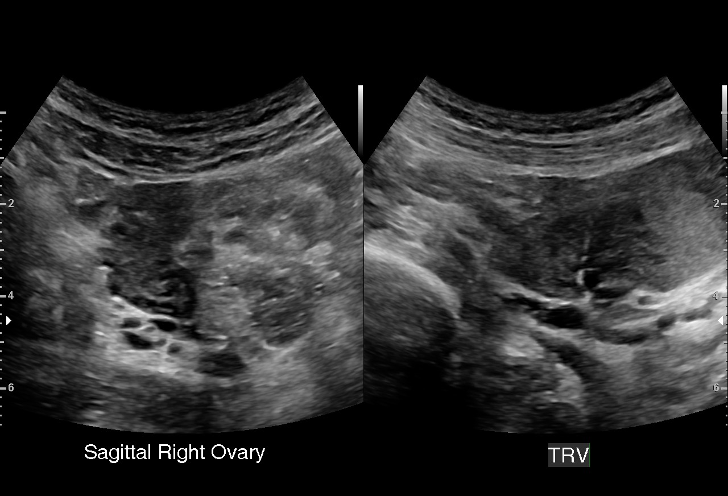
[im 18/48]
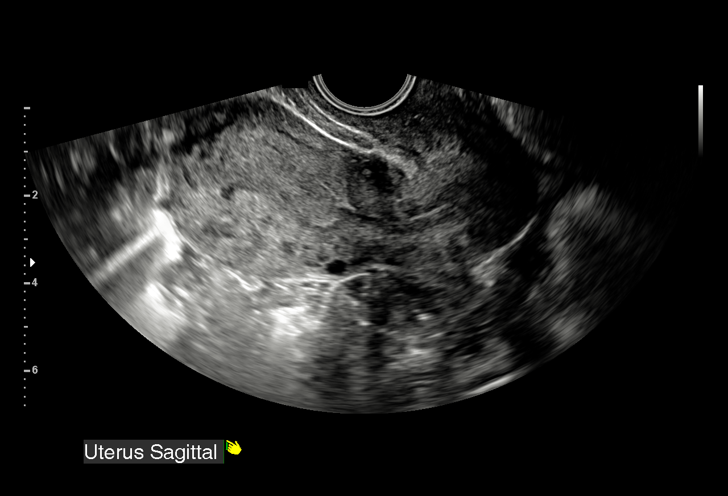
[im 21/48]
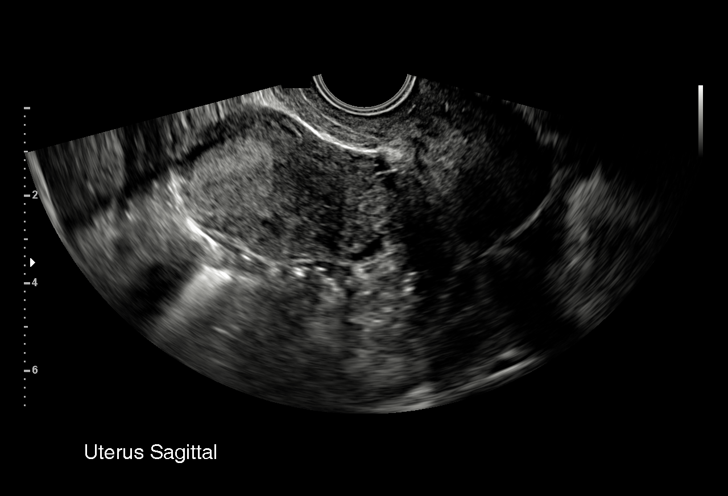
[im 25/48]
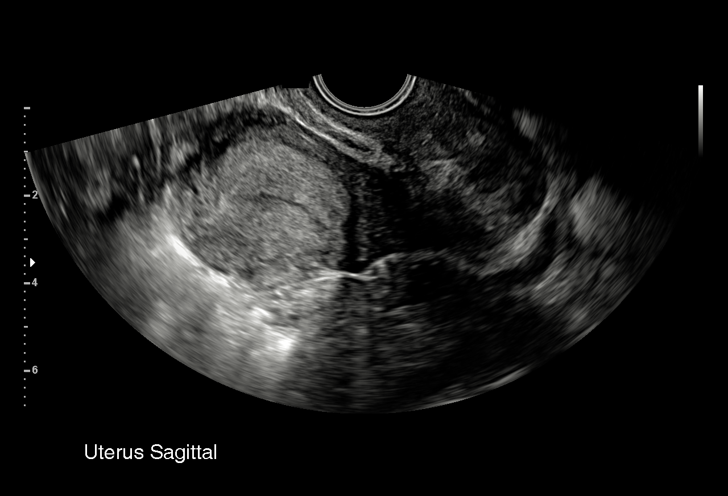
[im 27/48]
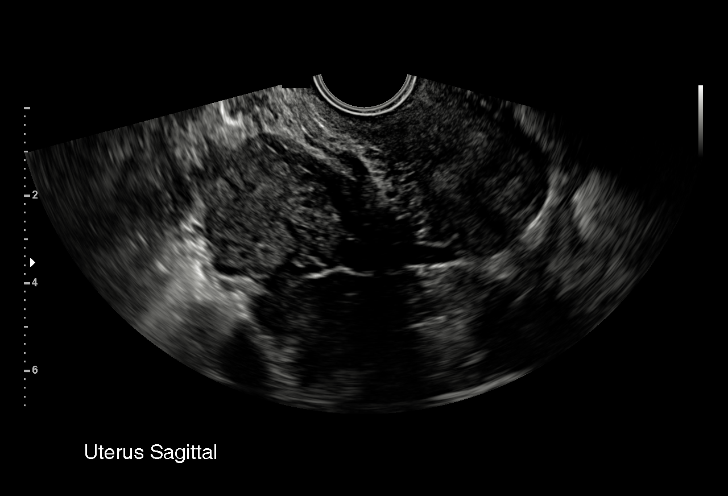
[im 30/48]
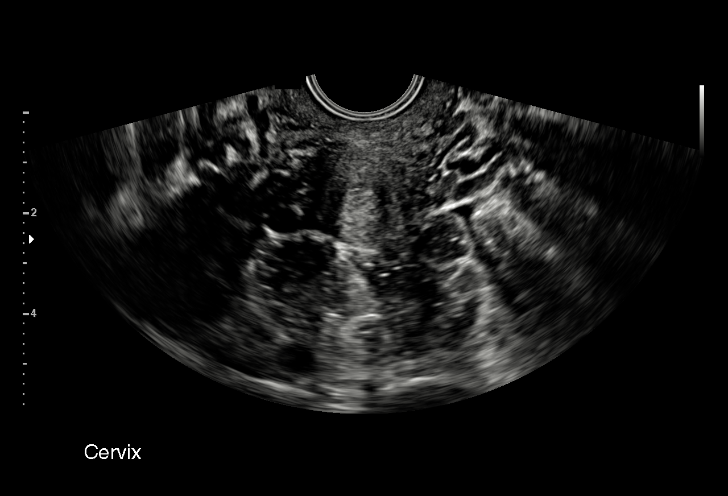
[im 34/48]
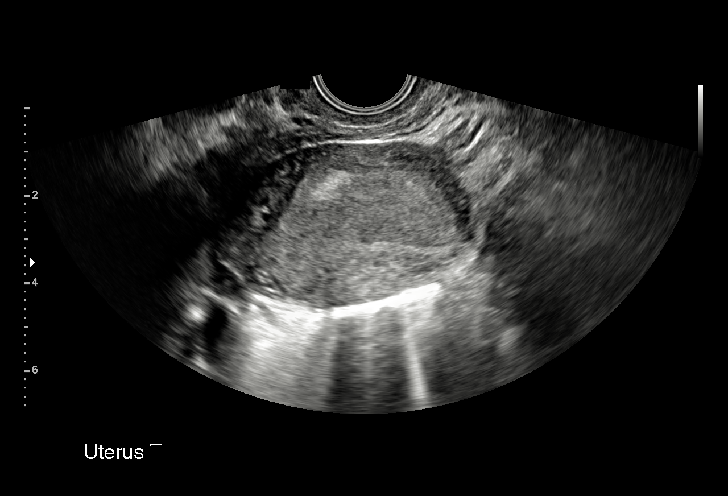
[im 37/48]
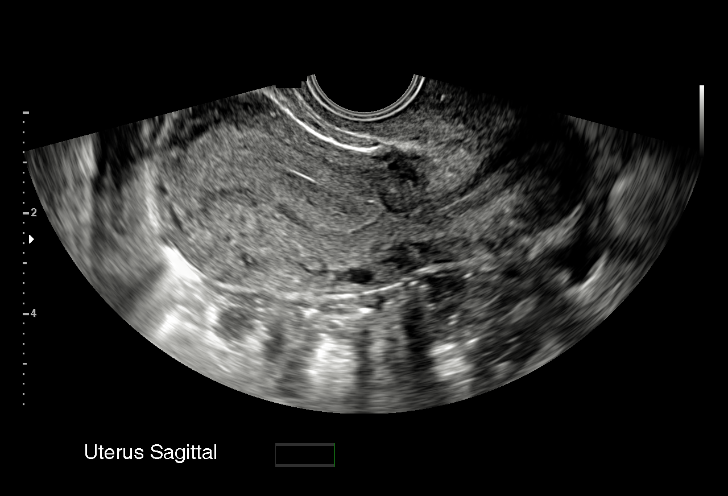
[im 41/48]
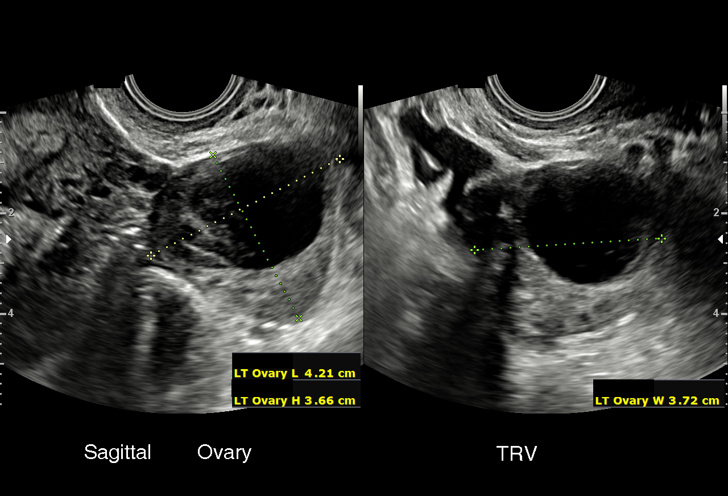
[im 44/48]
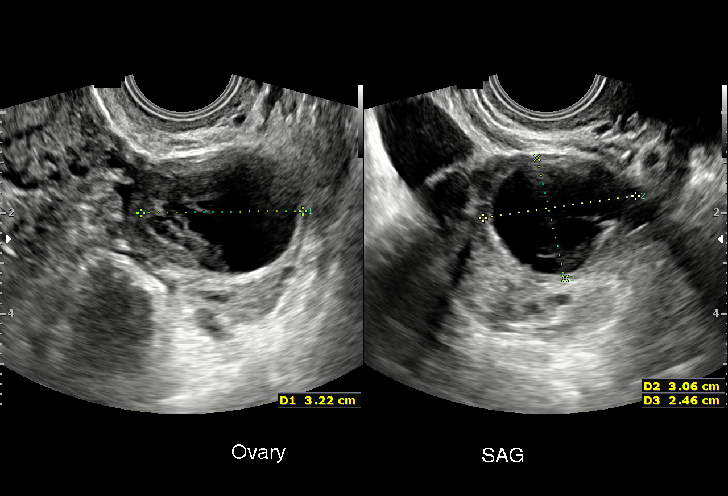
[im 48/48]
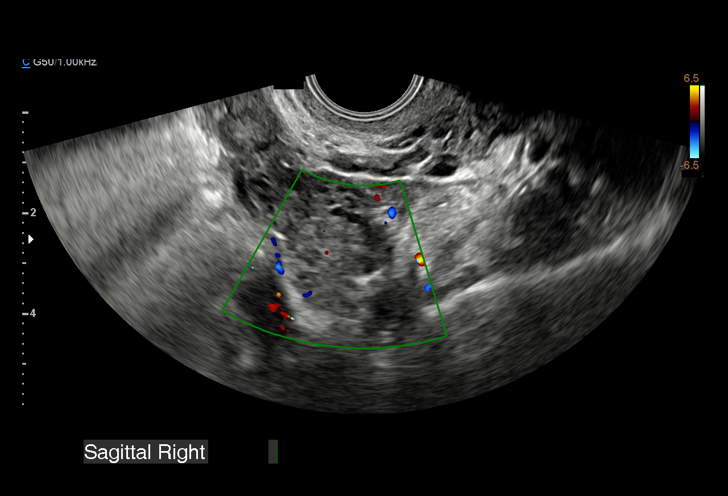

[15 of 28 positions shown; findings below may reference images not displayed]

FINDINGS: Intrauterine gestational sac: None.

Ectopic gestational sac: None imaged.

Maternal uterus/adnexae: A 13 mm endometrial thickness. No free
fluid in the pelvis. A [DATE] x 1.6 x 2.0 cm right ovary with normal
follicular pattern and blood flow on color Doppler imaging. A 4.0 x
3.0 x 4.0 cm left ovary with normal blood flow on color Doppler
imaging and a central 3.2 x 3.1 x 2.5 cm complex cyst without
peripheral hypervascularity on Doppler imaging.
IMPRESSION: No intrauterine gestational sac or apparent ectopic gestational sac.
Differential considerations include an early intrauterine pregnancy
or pregnancy of unknown location. Recommend serial quantitative beta
HCG levels and repeat ultrasonography as necessary.

A 3.2 cm left ovarian complex cyst, possibly an evolving hemorrhagic
corpus luteum cyst.

Normal right ovary.  No right or left ovarian torsion.

## 2021-10-12 IMAGING — US US OB TRANSVAGINAL
1 series · 15 of 28 positions shown · non-contrast
Comparison: 05/29/19.

CLINICAL DATA: Confirm viability.  Early pregnancy.

EXAM:
TRANSVAGINAL OB ULTRASOUND
TECHNIQUE: Transvaginal ultrasound was performed for complete evaluation of the
gestation as well as the maternal uterus, adnexal regions, and
pelvic cul-de-sac.

[Series 1: us ob transvaginal · 44 acquisitions, 15 frames shown]
[im 1/44]
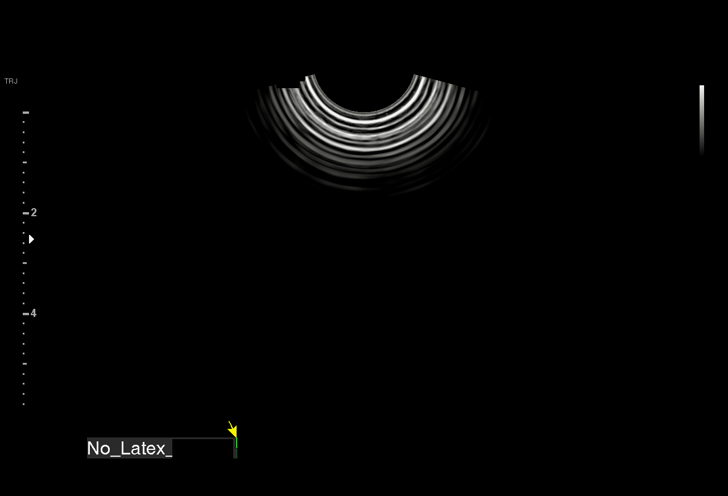
[im 4/44]
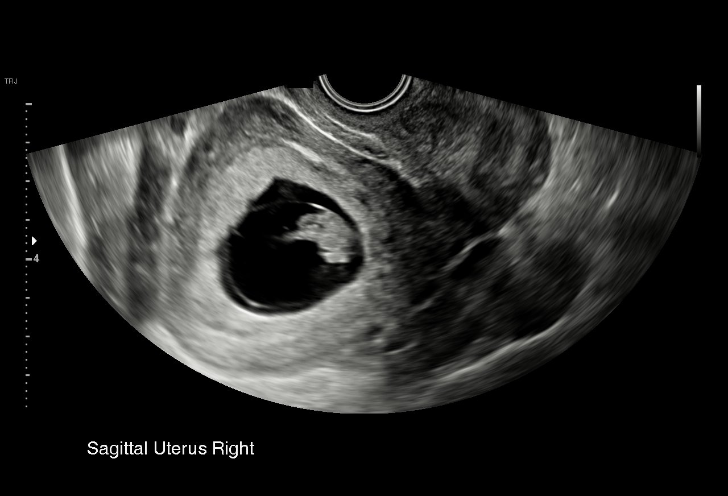
[im 7/44]
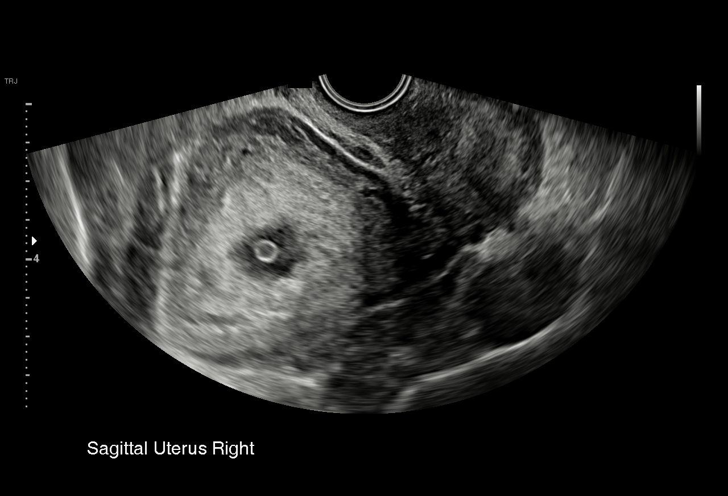
[im 10/44]
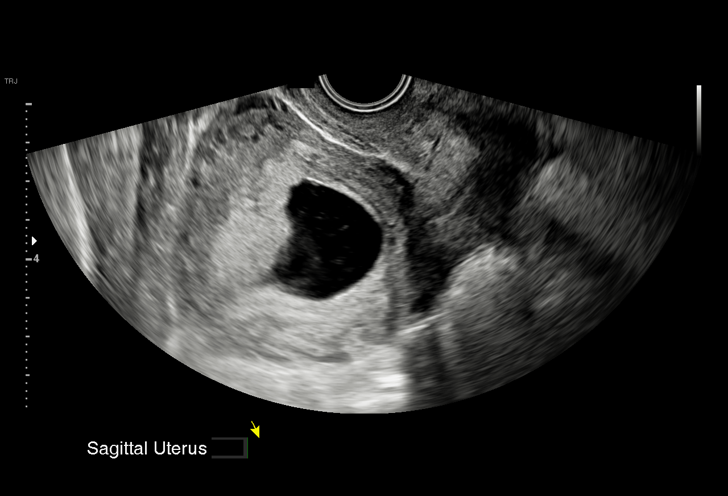
[im 13/44]
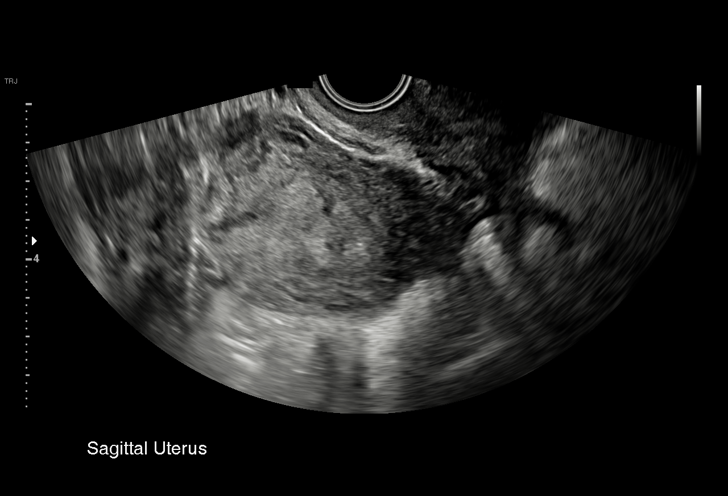
[im 16/44]
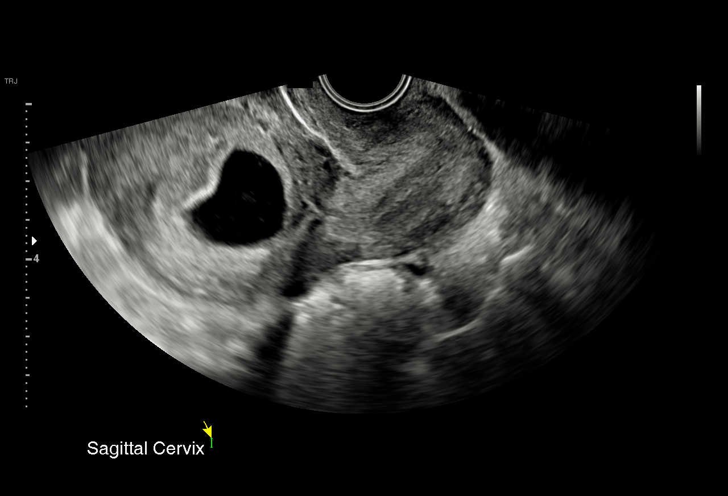
[im 20/44]
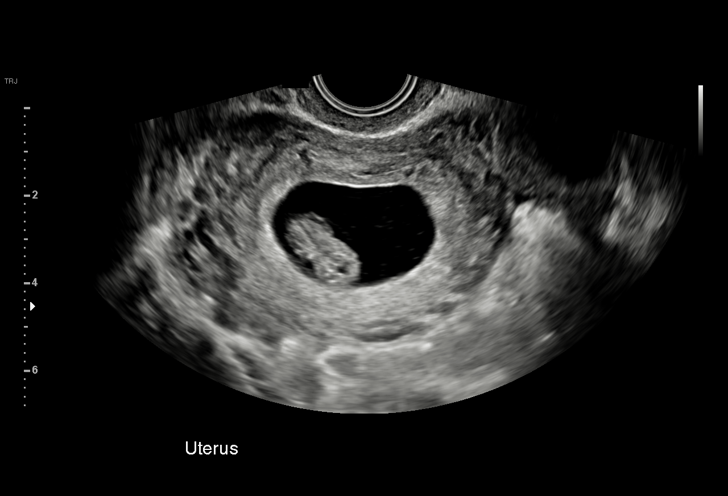
[im 23/44]
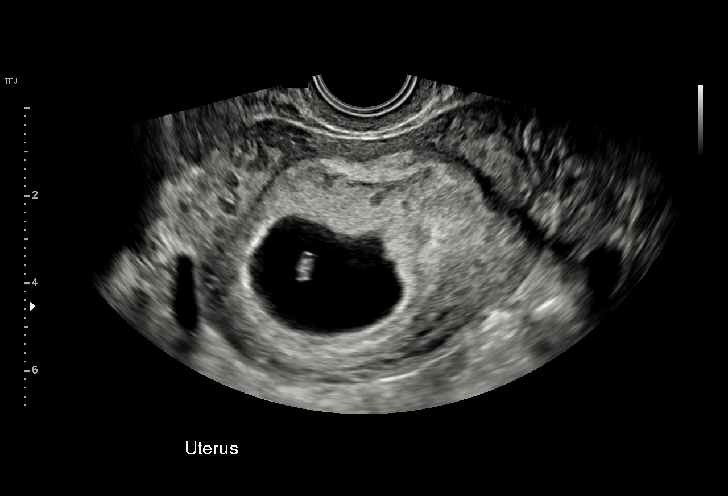
[im 24/44]
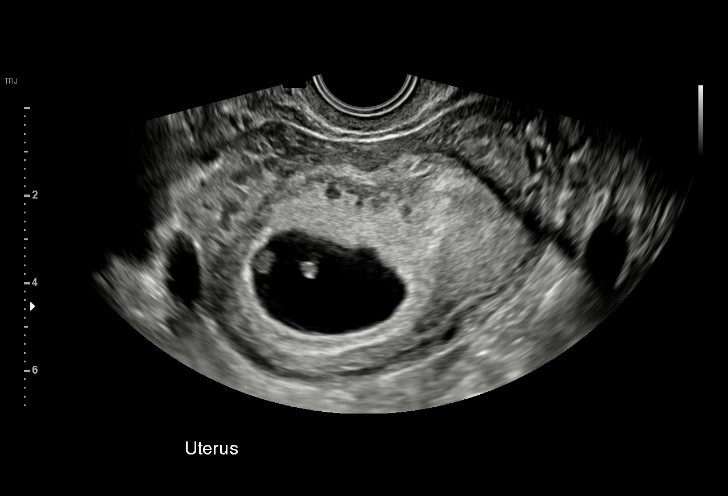
[im 28/44]
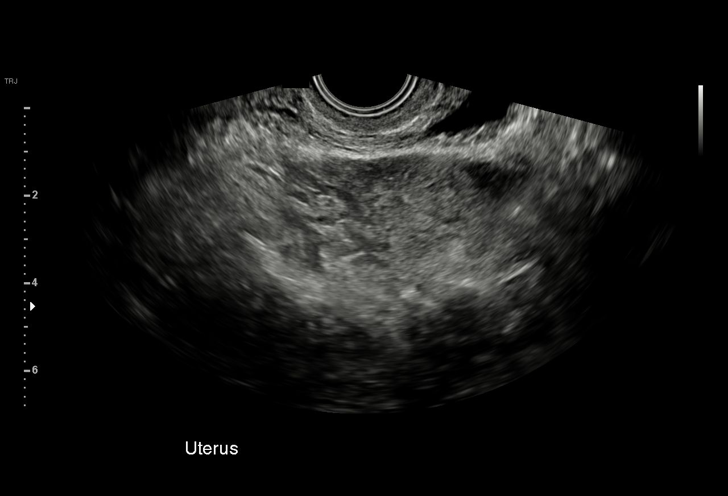
[im 31/44]
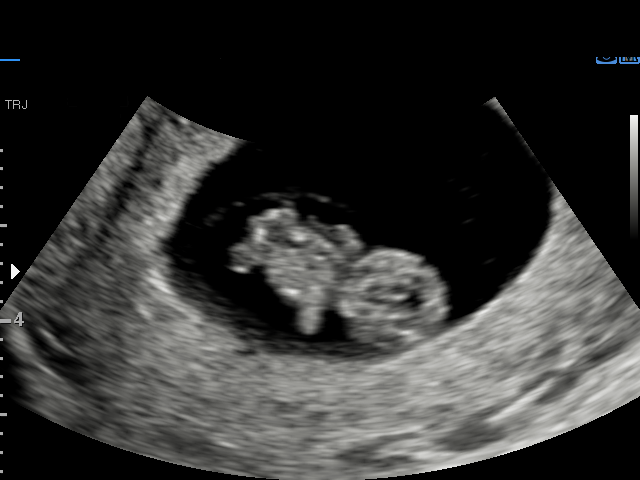
[im 34/44]
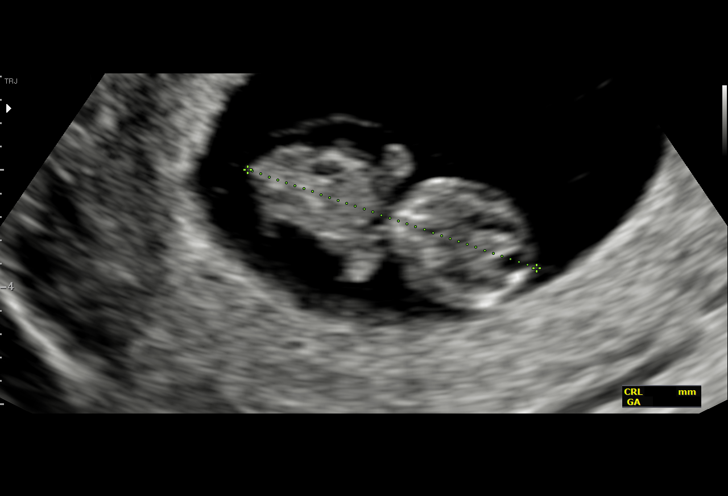
[im 37/44]
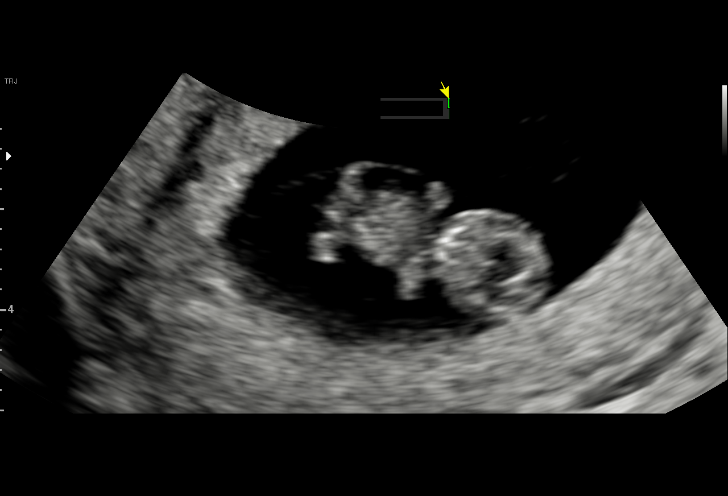
[im 40/44]
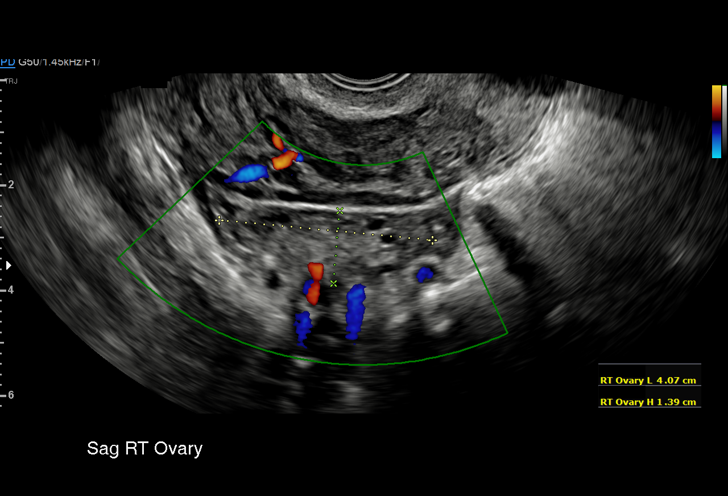
[im 44/44]
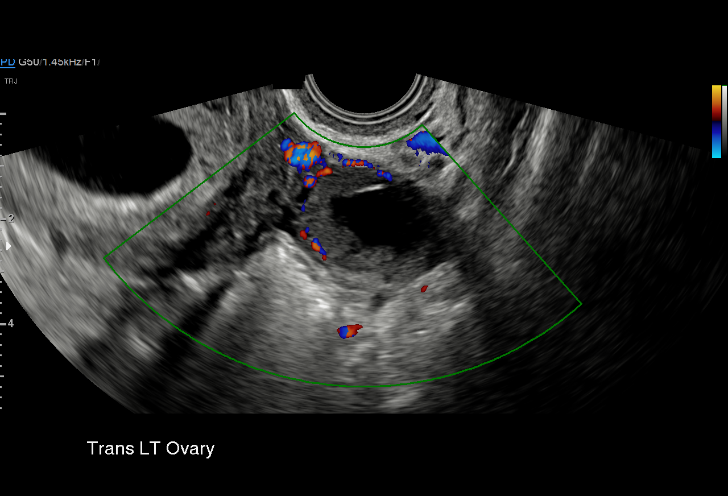

[15 of 28 positions shown; findings below may reference images not displayed]

FINDINGS: Intrauterine gestational sac: Single

Yolk sac:  Visualized.

Embryo:  Visualized.

Cardiac Activity: Visualized.

Heart Rate: 148 bpm

CRL:   25.9 mm   9 w 2 d                  US EDC: 01/23/2020

Subchorionic hemorrhage:  None

Maternal uterus/adnexae:

Right ovary: Normal

Left ovary: Normal containing corpus luteum

Other :None

Free fluid:  Trace
IMPRESSION: 1. Single living intrauterine gestation with an estimated
gestational age 9 weeks and 2 days. This is discordant with the
clinical gestational age of 11 weeks and 4 days.
2. No complications identified

## 2021-12-09 IMAGING — US US MFM OB DETAIL+14 WK
1 series · 13 of 28 positions shown · non-contrast
Comparison: none

[Series 1: us mfm ob detail+14 wk · 13 of 91 slices shown]
[im 4/91]
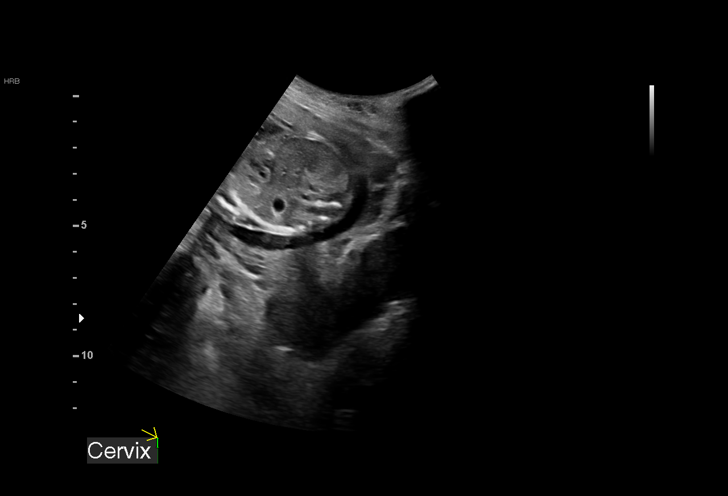
[im 11/91]
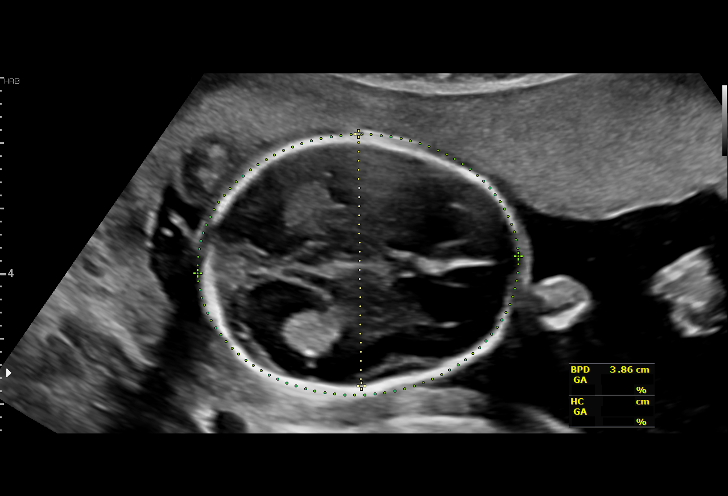
[im 17/91]
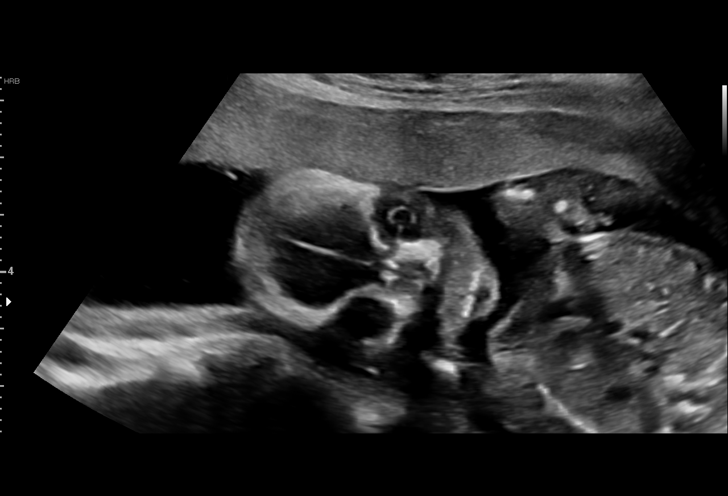
[im 24/91]
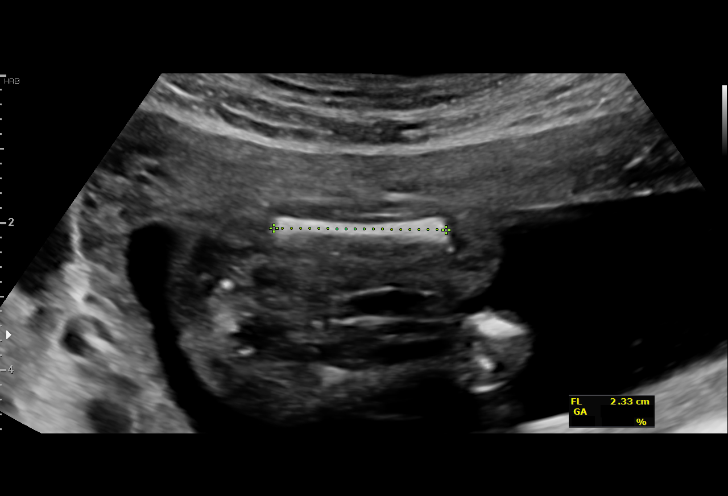
[im 31/91]
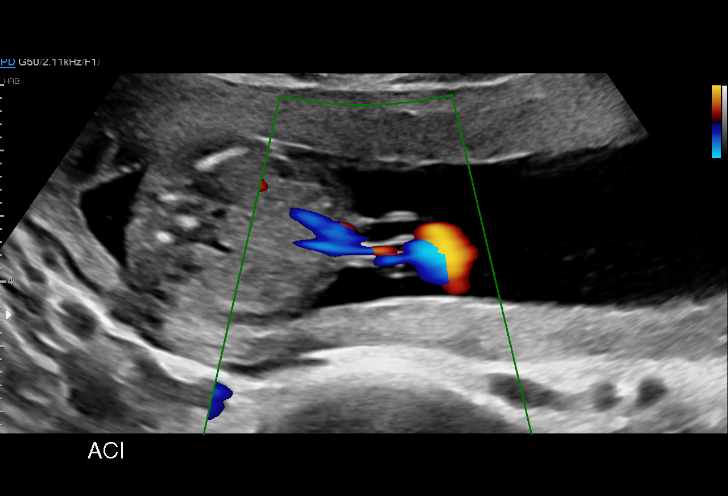
[im 37/91]
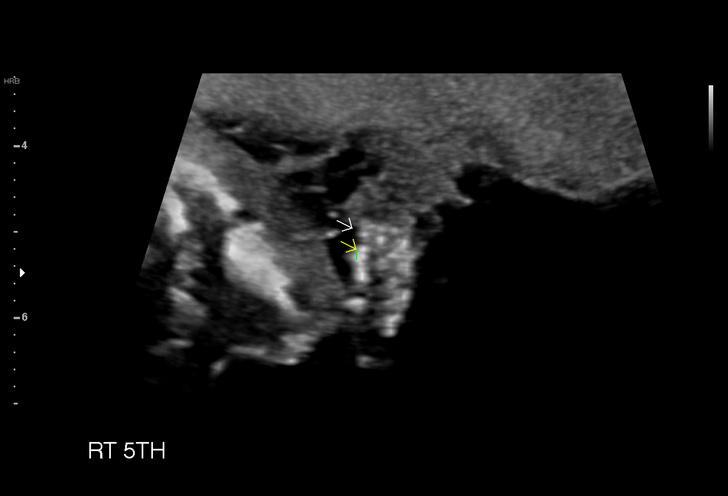
[im 47/91]
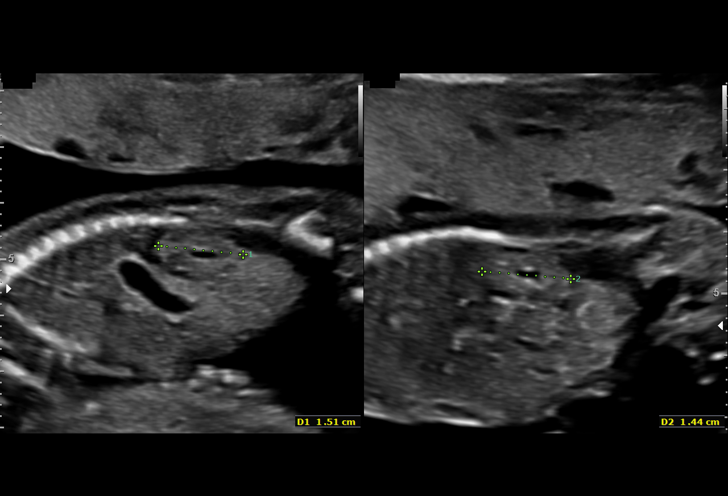
[im 54/91]
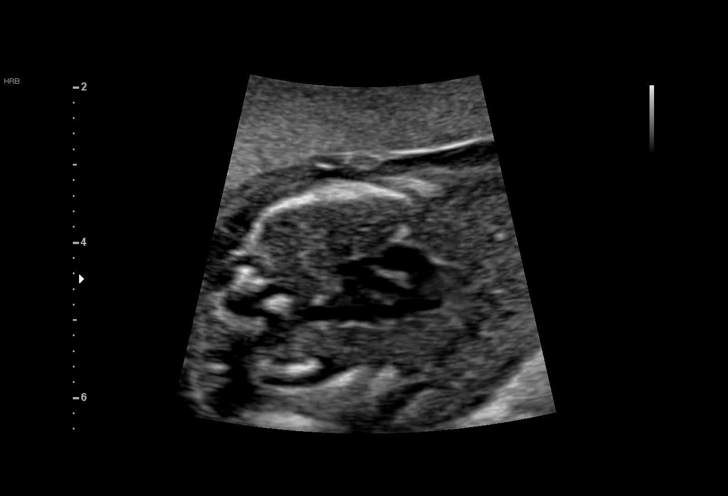
[im 61/91]
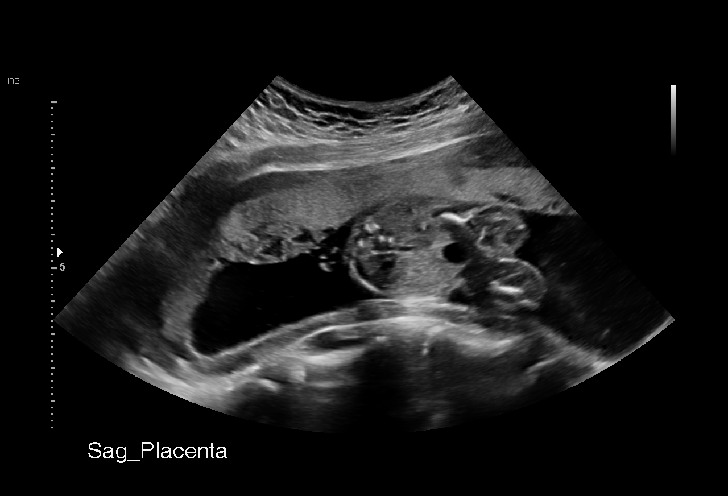
[im 67/91]
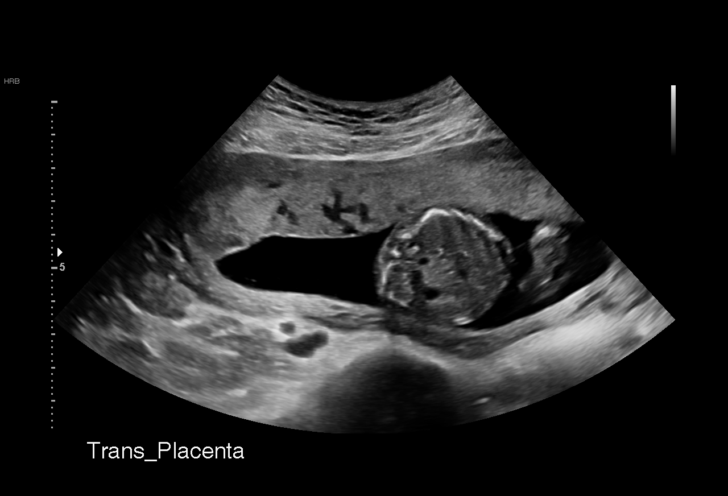
[im 74/91]
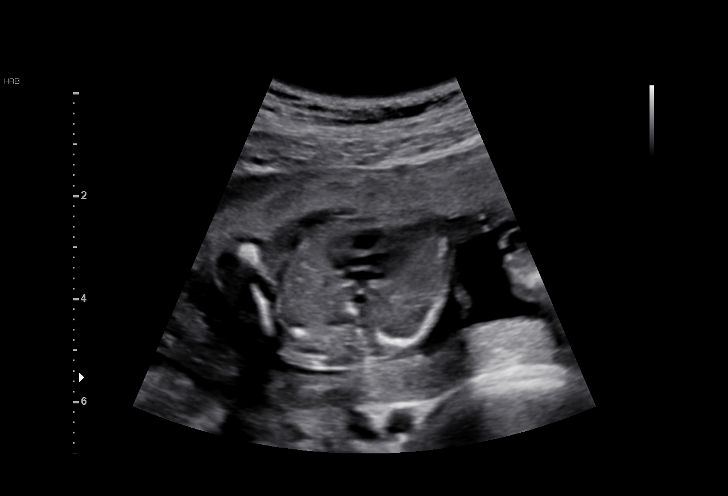
[im 81/91]
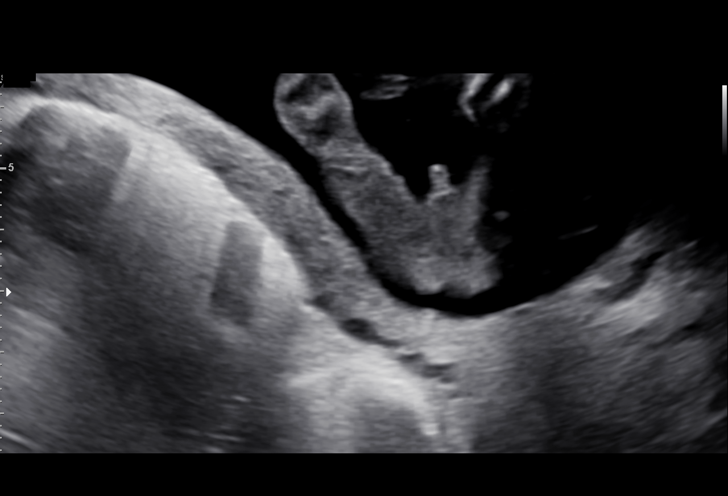
[im 87/91]
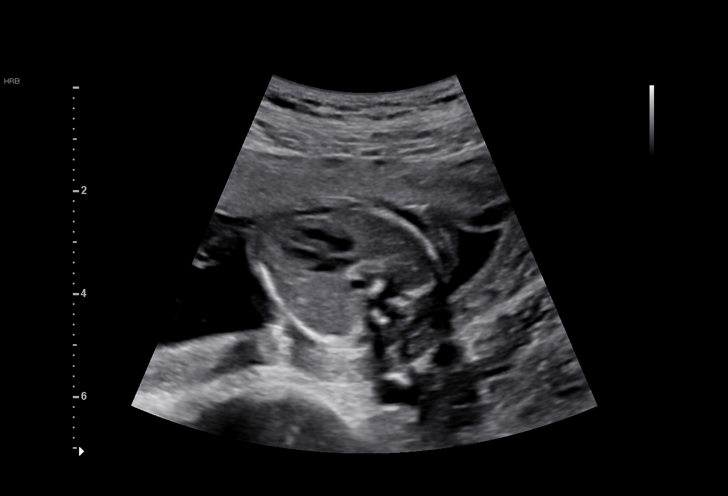

[13 of 28 positions shown; findings below may reference images not displayed]

Indications

 Abnormal biochemical screen (high risk due
 to fetal DNA fraction, WNL on repeat)
 Encounter for antenatal screening for
 malformations (horizon neg, neg AFP)
 17 weeks gestation of pregnancy
Vital Signs

 BMI:
Fetal Evaluation

 Num Of Fetuses:         1
 Cardiac Activity:       Observed
 Presentation:           Breech
 Placenta:               Anterior
 P. Cord Insertion:      Visualized, central

 Amniotic Fluid
 AFI FV:      Within normal limits

                             Largest Pocket(cm)

Biometry

 BPD:      38.1  mm     G. Age:  17w 4d         52  %    CI:        73.59   %    70 - 86
                                                         FL/HC:      16.1   %    15.8 - 18
 HC:      141.1  mm     G. Age:  17w 3d         33  %    HC/AC:      1.11        1.07 -
 AC:      127.6  mm     G. Age:  18w 2d         74  %    FL/BPD:     59.6   %
 FL:       22.7  mm     G. Age:  16w 6d         18  %    FL/AC:      17.8   %    20 - 24
 CER:      17.4  mm     G. Age:  17w 3d         46  %
 NFT:       5.2  mm

 LV:        7.1  mm
 CM:        4.1  mm
 Est. FW:     201  gm      0 lb 7 oz     45  %
OB History

 Gravidity:    3         Term:   0         SAB:   2
 Living:       0
Gestational Age

 U/S Today:     17w 4d                                        EDD:   01/23/20
 Best:          17w 4d     Det. By:  Early Ultrasound         EDD:   01/23/20
                                     (06/22/19)
Anatomy

 Cranium:               Appears normal         Aortic Arch:            Not well visualized
 Cavum:                 Appears normal         Ductal Arch:            Not well visualized
 Ventricles:            Appears normal         Diaphragm:              Appears normal
 Choroid Plexus:        Appears normal         Stomach:                Appears normal, left
                                                                       sided
 Cerebellum:            Appears normal         Abdomen:                Appears normal
 Posterior Fossa:       Appears normal         Abdominal Wall:         Appears nml (cord
                                                                       insert, abd wall)
 Nuchal Fold:           Appears normal         Cord Vessels:           Appears normal (3
                                                                       vessel cord)
 Face:                  Appears normal         Kidneys:                Appear normal
                        (orbits and profile)
 Lips:                  Appears normal         Bladder:                Appears normal
 Thoracic:              Appears normal         Spine:                  Appears normal
 Heart:                 Not well visualized    Upper Extremities:      Appears normal
 RVOT:                  Not well visualized    Lower Extremities:      Appears normal
 LVOT:                  Appears normal

 Other:  Male gender. Heels and 5th digit visualized. Technically difficult due
         to early gestational age.
Cervix Uterus Adnexa

 Cervix
 Length:           3.27  cm.
 Normal appearance by transabdominal scan.

 Uterus
 No abnormality visualized.

 Right Ovary
 Not visualized.

 Left Ovary
 Not visualized.

 Cul De Sac
 No free fluid seen.

 Adnexa
 No abnormality visualized.
Comments

 This patient was seen for a detailed fetal anatomy scan as
 the patient's first cell free DNA test drawn when she was
 about 12 weeks showed a high risk of triploidy, trisomy 18 or
 trisomy 13 due to a low fetal fraction. She subsequently had
 another cell free DNA test drawn when she was 16+ weeks
 which then showed a low risk for trisomy [DATE], and
 triploidy. A male fetus is predicted.
 She denies any significant past medical history and denies
 any problems in her current pregnancy.
 She was informed that the fetal growth and amniotic fluid
 level were appropriate for her gestational age.
 The views of the fetal anatomy were limited today due to her
 early gestational age.  However, there were no obvious
 anomalies noted today.
 Due to her initial cell free DNA test indicating a high risk for
 aneuploidy, the patient was offered and declined an
 amniocentesis today for definitive diagnosis of fetal
 aneuploidy.  She reports that she is comfortable with the
 negative cell free DNA test that was recently drawn.
 The patient was informed that anomalies may be missed due
 to technical limitations. If the fetus is in a suboptimal position
 or maternal habitus is increased, visualization of the fetus in
 the maternal uterus may be impaired.
 A follow-up exam was scheduled in 4 weeks to complete the
 views of the fetal anatomy.
